# Patient Record
Sex: Male | Born: 1945 | Hispanic: No | Marital: Married
Health system: Southern US, Community
[De-identification: ages and names within clinical notes are randomized; demographics above are authoritative.]

## PROBLEM LIST (undated history)

## (undated) DIAGNOSIS — S02113A Unspecified occipital condyle fracture, initial encounter for closed fracture: Secondary | ICD-10-CM

## (undated) DIAGNOSIS — S0285XA Fracture of orbit, unspecified, initial encounter for closed fracture: Secondary | ICD-10-CM

## (undated) DIAGNOSIS — I609 Nontraumatic subarachnoid hemorrhage, unspecified: Secondary | ICD-10-CM

## (undated) DIAGNOSIS — S065X9A Traumatic subdural hemorrhage with loss of consciousness of unspecified duration, initial encounter: Secondary | ICD-10-CM

## (undated) DIAGNOSIS — S065XAA Traumatic subdural hemorrhage with loss of consciousness status unknown, initial encounter: Secondary | ICD-10-CM

## (undated) DIAGNOSIS — S32009A Unspecified fracture of unspecified lumbar vertebra, initial encounter for closed fracture: Secondary | ICD-10-CM

## (undated) DIAGNOSIS — S12000A Unspecified displaced fracture of first cervical vertebra, initial encounter for closed fracture: Secondary | ICD-10-CM

## (undated) DIAGNOSIS — S36029A Unspecified contusion of spleen, initial encounter: Secondary | ICD-10-CM

## (undated) DIAGNOSIS — S22069A Unspecified fracture of T7-T8 vertebra, initial encounter for closed fracture: Secondary | ICD-10-CM

## (undated) DIAGNOSIS — S82891A Other fracture of right lower leg, initial encounter for closed fracture: Secondary | ICD-10-CM

## (undated) DIAGNOSIS — F419 Anxiety disorder, unspecified: Secondary | ICD-10-CM

## (undated) DIAGNOSIS — S27329A Contusion of lung, unspecified, initial encounter: Secondary | ICD-10-CM

## (undated) DIAGNOSIS — S22059A Unspecified fracture of T5-T6 vertebra, initial encounter for closed fracture: Secondary | ICD-10-CM

## (undated) DIAGNOSIS — D696 Thrombocytopenia, unspecified: Secondary | ICD-10-CM

## (undated) DIAGNOSIS — S12400A Unspecified displaced fracture of fifth cervical vertebra, initial encounter for closed fracture: Secondary | ICD-10-CM

## (undated) DIAGNOSIS — Z9911 Dependence on respirator [ventilator] status: Secondary | ICD-10-CM

## (undated) DIAGNOSIS — J9600 Acute respiratory failure, unspecified whether with hypoxia or hypercapnia: Secondary | ICD-10-CM

---

## 2014-06-08 ENCOUNTER — Other Ambulatory Visit (HOSPITAL_COMMUNITY): Payer: Medicare Other

## 2014-06-08 ENCOUNTER — Ambulatory Visit (HOSPITAL_COMMUNITY)
Admission: AD | Admit: 2014-06-08 | Discharge: 2014-06-08 | Disposition: A | Discharge status: Home / Self Care | Payer: Medicare Other | Source: Other Acute Inpatient Hospital | Attending: Internal Medicine | Admitting: Internal Medicine

## 2014-06-08 ENCOUNTER — Inpatient Hospital Stay
Admission: AD | Admit: 2014-06-08 | Discharge: 2014-07-09 | Disposition: A | Discharge status: Rehabilitation Facility | Payer: Medicare Other | Source: Ambulatory Visit | Attending: Internal Medicine | Admitting: Internal Medicine

## 2014-06-08 DIAGNOSIS — J9600 Acute respiratory failure, unspecified whether with hypoxia or hypercapnia: Secondary | ICD-10-CM | POA: Diagnosis present

## 2014-06-08 DIAGNOSIS — Z4659 Encounter for fitting and adjustment of other gastrointestinal appliance and device: Secondary | ICD-10-CM

## 2014-06-08 DIAGNOSIS — Z13810 Encounter for screening for upper gastrointestinal disorder: Secondary | ICD-10-CM

## 2014-06-08 DIAGNOSIS — J9690 Respiratory failure, unspecified, unspecified whether with hypoxia or hypercapnia: Secondary | ICD-10-CM | POA: Diagnosis present

## 2014-06-08 DIAGNOSIS — J9620 Acute and chronic respiratory failure, unspecified whether with hypoxia or hypercapnia: Secondary | ICD-10-CM

## 2014-06-08 DIAGNOSIS — R52 Pain, unspecified: Secondary | ICD-10-CM

## 2014-06-08 DIAGNOSIS — K567 Ileus, unspecified: Secondary | ICD-10-CM

## 2014-06-08 DIAGNOSIS — Z930 Tracheostomy status: Secondary | ICD-10-CM

## 2014-06-08 DIAGNOSIS — Z452 Encounter for adjustment and management of vascular access device: Secondary | ICD-10-CM

## 2014-06-08 DIAGNOSIS — R110 Nausea: Secondary | ICD-10-CM

## 2014-06-08 DIAGNOSIS — S12400S Unspecified displaced fracture of fifth cervical vertebra, sequela: Secondary | ICD-10-CM

## 2014-06-08 DIAGNOSIS — R143 Flatulence: Secondary | ICD-10-CM

## 2014-06-08 DIAGNOSIS — S069X4A Unspecified intracranial injury with loss of consciousness of 6 hours to 24 hours, initial encounter: Secondary | ICD-10-CM | POA: Diagnosis present

## 2014-06-08 DIAGNOSIS — E43 Unspecified severe protein-calorie malnutrition: Secondary | ICD-10-CM | POA: Insufficient documentation

## 2014-06-08 DIAGNOSIS — R140 Abdominal distension (gaseous): Secondary | ICD-10-CM

## 2014-06-08 DIAGNOSIS — T148XXA Other injury of unspecified body region, initial encounter: Secondary | ICD-10-CM

## 2014-06-08 DIAGNOSIS — R0902 Hypoxemia: Secondary | ICD-10-CM

## 2014-06-08 DIAGNOSIS — S12400A Unspecified displaced fracture of fifth cervical vertebra, initial encounter for closed fracture: Secondary | ICD-10-CM | POA: Diagnosis present

## 2014-06-08 DIAGNOSIS — J9621 Acute and chronic respiratory failure with hypoxia: Secondary | ICD-10-CM

## 2014-06-08 DIAGNOSIS — R1314 Dysphagia, pharyngoesophageal phase: Secondary | ICD-10-CM | POA: Insufficient documentation

## 2014-06-08 DIAGNOSIS — J189 Pneumonia, unspecified organism: Secondary | ICD-10-CM

## 2014-06-08 DIAGNOSIS — N3090 Cystitis, unspecified without hematuria: Secondary | ICD-10-CM

## 2014-06-08 DIAGNOSIS — Z431 Encounter for attention to gastrostomy: Secondary | ICD-10-CM

## 2014-06-08 HISTORY — DX: Traumatic subdural hemorrhage with loss of consciousness of unspecified duration, initial encounter: S06.5X9A

## 2014-06-08 HISTORY — DX: Other fracture of right lower leg, initial encounter for closed fracture: S82.891A

## 2014-06-08 HISTORY — DX: Unspecified fracture of T7-t8 vertebra, initial encounter for closed fracture: S22.069A

## 2014-06-08 HISTORY — DX: Thrombocytopenia, unspecified: D69.6

## 2014-06-08 HISTORY — DX: Unspecified fracture of t5-T6 vertebra, initial encounter for closed fracture: S22.059A

## 2014-06-08 HISTORY — DX: Unspecified occipital condyle fracture, initial encounter for closed fracture: S02.113A

## 2014-06-08 HISTORY — DX: Unspecified fracture of unspecified lumbar vertebra, initial encounter for closed fracture: S32.009A

## 2014-06-08 HISTORY — DX: Dependence on respirator (ventilator) status: Z99.11

## 2014-06-08 HISTORY — DX: Unspecified displaced fracture of first cervical vertebra, initial encounter for closed fracture: S12.000A

## 2014-06-08 HISTORY — DX: Acute respiratory failure, unspecified whether with hypoxia or hypercapnia: J96.00

## 2014-06-08 HISTORY — DX: Anxiety disorder, unspecified: F41.9

## 2014-06-08 HISTORY — DX: Unspecified displaced fracture of fifth cervical vertebra, initial encounter for closed fracture: S12.400A

## 2014-06-08 HISTORY — DX: Traumatic subdural hemorrhage with loss of consciousness status unknown, initial encounter: S06.5XAA

## 2014-06-08 HISTORY — DX: Contusion of lung, unspecified, initial encounter: S27.329A

## 2014-06-08 HISTORY — DX: Fracture of orbit, unspecified, initial encounter for closed fracture: S02.85XA

## 2014-06-08 HISTORY — DX: Nontraumatic subarachnoid hemorrhage, unspecified: I60.9

## 2014-06-08 HISTORY — DX: Unspecified contusion of spleen, initial encounter: S36.029A

## 2014-06-08 LAB — BLOOD GAS, ARTERIAL
Acid-Base Excess: 4.8 mmol/L — ABNORMAL HIGH (ref 0.0–2.0)
Bicarbonate: 28.3 mEq/L — ABNORMAL HIGH (ref 20.0–24.0)
FIO2: 0.4 %
MECHVT: 500 mL
O2 Saturation: 94.3 %
PCO2 ART: 38.9 mmHg (ref 35.0–45.0)
PEEP: 5 cmH2O
Patient temperature: 98.6
RATE: 16 resp/min
TCO2: 29.5 mmol/L (ref 0–100)
pH, Arterial: 7.476 — ABNORMAL HIGH (ref 7.350–7.450)
pO2, Arterial: 74.6 mmHg — ABNORMAL LOW (ref 80.0–100.0)

## 2014-06-09 ENCOUNTER — Encounter: Payer: Self-pay | Admitting: Pulmonary Disease

## 2014-06-09 ENCOUNTER — Other Ambulatory Visit (HOSPITAL_COMMUNITY): Payer: Medicare Other

## 2014-06-09 DIAGNOSIS — Z930 Tracheostomy status: Secondary | ICD-10-CM

## 2014-06-09 DIAGNOSIS — J9600 Acute respiratory failure, unspecified whether with hypoxia or hypercapnia: Secondary | ICD-10-CM | POA: Diagnosis present

## 2014-06-09 DIAGNOSIS — J189 Pneumonia, unspecified organism: Secondary | ICD-10-CM

## 2014-06-09 DIAGNOSIS — S12400A Unspecified displaced fracture of fifth cervical vertebra, initial encounter for closed fracture: Secondary | ICD-10-CM | POA: Diagnosis present

## 2014-06-09 DIAGNOSIS — J9620 Acute and chronic respiratory failure, unspecified whether with hypoxia or hypercapnia: Secondary | ICD-10-CM

## 2014-06-09 DIAGNOSIS — S069X4A Unspecified intracranial injury with loss of consciousness of 6 hours to 24 hours, initial encounter: Secondary | ICD-10-CM | POA: Diagnosis present

## 2014-06-09 LAB — URINALYSIS, ROUTINE W REFLEX MICROSCOPIC
Bilirubin Urine: NEGATIVE
Glucose, UA: NEGATIVE mg/dL
KETONES UR: NEGATIVE mg/dL
Leukocytes, UA: NEGATIVE
Nitrite: NEGATIVE
Protein, ur: 30 mg/dL — AB
Specific Gravity, Urine: 1.028 (ref 1.005–1.030)
UROBILINOGEN UA: 4 mg/dL — AB (ref 0.0–1.0)
pH: 5.5 (ref 5.0–8.0)

## 2014-06-09 LAB — CBC
HEMATOCRIT: 26.9 % — AB (ref 39.0–52.0)
HEMOGLOBIN: 8.7 g/dL — AB (ref 13.0–17.0)
MCH: 28.8 pg (ref 26.0–34.0)
MCHC: 32.3 g/dL (ref 30.0–36.0)
MCV: 89.1 fL (ref 78.0–100.0)
Platelets: 456 10*3/uL — ABNORMAL HIGH (ref 150–400)
RBC: 3.02 MIL/uL — ABNORMAL LOW (ref 4.22–5.81)
RDW: 14 % (ref 11.5–15.5)
WBC: 11.8 10*3/uL — ABNORMAL HIGH (ref 4.0–10.5)

## 2014-06-09 LAB — VANCOMYCIN, TROUGH: Vancomycin Tr: 9.2 ug/mL — ABNORMAL LOW (ref 10.0–20.0)

## 2014-06-09 LAB — COMPREHENSIVE METABOLIC PANEL
ALK PHOS: 118 U/L — AB (ref 39–117)
ALT: 45 U/L (ref 0–53)
AST: 61 U/L — ABNORMAL HIGH (ref 0–37)
Albumin: 1.5 g/dL — ABNORMAL LOW (ref 3.5–5.2)
Anion gap: 7 (ref 5–15)
BUN: 32 mg/dL — AB (ref 6–23)
CHLORIDE: 112 meq/L (ref 96–112)
CO2: 28 mEq/L (ref 19–32)
Calcium: 8.3 mg/dL — ABNORMAL LOW (ref 8.4–10.5)
Creatinine, Ser: 0.56 mg/dL (ref 0.50–1.35)
GFR calc Af Amer: >90 mL/min (ref >90)
GFR calc non Af Amer: >90 mL/min (ref >90)
Glucose, Bld: 120 mg/dL — ABNORMAL HIGH (ref 70–99)
POTASSIUM: 4.4 meq/L (ref 3.7–5.3)
Sodium: 147 mEq/L (ref 137–147)
Total Bilirubin: 0.5 mg/dL (ref 0.3–1.2)
Total Protein: 5.8 g/dL — ABNORMAL LOW (ref 6.0–8.3)

## 2014-06-09 LAB — PREALBUMIN: Prealbumin: 7.3 mg/dL — ABNORMAL LOW (ref 17.0–34.0)

## 2014-06-09 LAB — URINE MICROSCOPIC-ADD ON

## 2014-06-09 LAB — PROCALCITONIN: PROCALCITONIN: 0.64 ng/mL

## 2014-06-09 LAB — TSH: TSH: 0.376 u[IU]/mL (ref 0.350–4.500)
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
## 2014-06-10 LAB — HEMOGLOBIN A1C
HEMOGLOBIN A1C: 5.9 % — AB (ref <5.7)
Mean Plasma Glucose: 123 mg/dL — ABNORMAL HIGH (ref <117)

## 2014-06-10 LAB — URINE MICROSCOPIC-ADD ON

## 2014-06-10 LAB — URINALYSIS, ROUTINE W REFLEX MICROSCOPIC
BILIRUBIN URINE: NEGATIVE
GLUCOSE, UA: NEGATIVE mg/dL
KETONES UR: NEGATIVE mg/dL
Leukocytes, UA: NEGATIVE
Nitrite: NEGATIVE
PH: 5.5 (ref 5.0–8.0)
Protein, ur: NEGATIVE mg/dL
Specific Gravity, Urine: 1.024 (ref 1.005–1.030)
Urobilinogen, UA: 1 mg/dL (ref 0.0–1.0)

## 2014-06-10 LAB — BASIC METABOLIC PANEL
ANION GAP: 9 (ref 5–15)
BUN: 31 mg/dL — ABNORMAL HIGH (ref 6–23)
CALCIUM: 8 mg/dL — AB (ref 8.4–10.5)
CO2: 28 mEq/L (ref 19–32)
CREATININE: 0.62 mg/dL (ref 0.50–1.35)
Chloride: 110 mEq/L (ref 96–112)
GFR calc Af Amer: >90 mL/min (ref >90)
GFR calc non Af Amer: >90 mL/min (ref >90)
Glucose, Bld: 134 mg/dL — ABNORMAL HIGH (ref 70–99)
Potassium: 4.1 mEq/L (ref 3.7–5.3)
Sodium: 147 mEq/L (ref 137–147)

## 2014-06-10 LAB — CBC
HCT: 31.8 % — ABNORMAL LOW (ref 39.0–52.0)
Hemoglobin: 10 g/dL — ABNORMAL LOW (ref 13.0–17.0)
MCH: 28.2 pg (ref 26.0–34.0)
MCHC: 31.4 g/dL (ref 30.0–36.0)
MCV: 89.6 fL (ref 78.0–100.0)
PLATELETS: 372 10*3/uL (ref 150–400)
RBC: 3.55 MIL/uL — ABNORMAL LOW (ref 4.22–5.81)
RDW: 14.2 % (ref 11.5–15.5)
WBC: 11 10*3/uL — ABNORMAL HIGH (ref 4.0–10.5)

## 2014-06-10 LAB — MAGNESIUM: Magnesium: 2.3 mg/dL (ref 1.5–2.5)

## 2014-06-10 LAB — PHOSPHORUS: Phosphorus: 3 mg/dL (ref 2.3–4.6)
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
## 2014-06-11 DIAGNOSIS — S12400S Unspecified displaced fracture of fifth cervical vertebra, sequela: Secondary | ICD-10-CM

## 2014-06-11 DIAGNOSIS — Z930 Tracheostomy status: Secondary | ICD-10-CM

## 2014-06-11 DIAGNOSIS — J9621 Acute and chronic respiratory failure with hypoxia: Secondary | ICD-10-CM

## 2014-06-11 LAB — URINE CULTURE
Colony Count: NO GROWTH
Culture: NO GROWTH

## 2014-06-11 LAB — VANCOMYCIN, TROUGH: VANCOMYCIN TR: 10.3 ug/mL (ref 10.0–20.0)

## 2014-06-11 LAB — VITAMIN D 25 HYDROXY (VIT D DEFICIENCY, FRACTURES): Vit D, 25-Hydroxy: 70 ng/mL (ref 30–89)
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
## 2014-06-12 LAB — CULTURE, RESPIRATORY W GRAM STAIN

## 2014-06-12 LAB — CULTURE, RESPIRATORY

## 2014-06-13 LAB — TOBRAMYCIN LEVEL, RANDOM: TOBRAMYCIN RM: 1.6 ug/mL

## 2014-06-15 ENCOUNTER — Other Ambulatory Visit (HOSPITAL_COMMUNITY): Payer: Medicare Other

## 2014-06-15 LAB — BLOOD GAS, ARTERIAL
ACID-BASE EXCESS: 5.3 mmol/L — AB (ref 0.0–2.0)
Bicarbonate: 28.5 mEq/L — ABNORMAL HIGH (ref 20.0–24.0)
FIO2: 0.28 %
O2 Saturation: 96.4 %
PEEP: 5 cmH2O
Patient temperature: 98.6
RATE: 16 resp/min
TCO2: 29.6 mmol/L (ref 0–100)
VT: 500 mL
pCO2 arterial: 36.2 mmHg (ref 35.0–45.0)
pH, Arterial: 7.507 — ABNORMAL HIGH (ref 7.350–7.450)
pO2, Arterial: 79.2 mmHg — ABNORMAL LOW (ref 80.0–100.0)

## 2014-06-15 LAB — COMPREHENSIVE METABOLIC PANEL
ALBUMIN: 1.8 g/dL — AB (ref 3.5–5.2)
ALT: 43 U/L (ref 0–53)
ANION GAP: 11 (ref 5–15)
AST: 33 U/L (ref 0–37)
Alkaline Phosphatase: 132 U/L — ABNORMAL HIGH (ref 39–117)
BUN: 30 mg/dL — AB (ref 6–23)
CALCIUM: 8.2 mg/dL — AB (ref 8.4–10.5)
CO2: 27 mEq/L (ref 19–32)
CREATININE: 0.52 mg/dL (ref 0.50–1.35)
Chloride: 105 mEq/L (ref 96–112)
GFR calc Af Amer: >90 mL/min (ref >90)
GFR calc non Af Amer: >90 mL/min (ref >90)
Glucose, Bld: 146 mg/dL — ABNORMAL HIGH (ref 70–99)
Potassium: 4.1 mEq/L (ref 3.7–5.3)
Sodium: 143 mEq/L (ref 137–147)
TOTAL PROTEIN: 6 g/dL (ref 6.0–8.3)
Total Bilirubin: 0.4 mg/dL (ref 0.3–1.2)

## 2014-06-15 LAB — CBC
HEMATOCRIT: 29.1 % — AB (ref 39.0–52.0)
HEMOGLOBIN: 9.3 g/dL — AB (ref 13.0–17.0)
MCH: 28.5 pg (ref 26.0–34.0)
MCHC: 32 g/dL (ref 30.0–36.0)
MCV: 89.3 fL (ref 78.0–100.0)
Platelets: 289 10*3/uL (ref 150–400)
RBC: 3.26 MIL/uL — AB (ref 4.22–5.81)
RDW: 14.4 % (ref 11.5–15.5)
WBC: 14.5 10*3/uL — AB (ref 4.0–10.5)

## 2014-06-15 LAB — VANCOMYCIN, TROUGH: Vancomycin Tr: <5 ug/mL — ABNORMAL LOW (ref 10.0–20.0)

## 2014-06-15 LAB — PHOSPHORUS: PHOSPHORUS: 3.4 mg/dL (ref 2.3–4.6)

## 2014-06-15 LAB — MAGNESIUM: Magnesium: 2.1 mg/dL (ref 1.5–2.5)

## 2014-06-15 LAB — TOBRAMYCIN LEVEL, TROUGH: TOBRAMYCIN TR: 0.5 ug/mL (ref 0.5–2.0)
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
## 2014-06-15 NOTE — Progress Notes (Signed)
Patient ID: Hunter MartesDwight Calles, male   DOB: 12/27/1945, 68 y.o.   MRN: [id number]  Poss Perc G tube  10/12 KUB unchanged ileus Cannot safely perform perc G tube Await ileus to resolve  Please re order when resolved
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
## 2014-06-16 LAB — CULTURE, BLOOD (ROUTINE X 2)
CULTURE: NO GROWTH
Culture: NO GROWTH
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
## 2014-06-17 ENCOUNTER — Other Ambulatory Visit (HOSPITAL_COMMUNITY): Payer: Medicare Other

## 2014-06-17 LAB — DIFFERENTIAL
BASOS PCT: 0 % (ref 0–1)
Basophils Absolute: 0 10*3/uL (ref 0.0–0.1)
EOS ABS: 0.2 10*3/uL (ref 0.0–0.7)
Eosinophils Relative: 3 % (ref 0–5)
Lymphocytes Relative: 10 % — ABNORMAL LOW (ref 12–46)
Lymphs Abs: 0.8 10*3/uL (ref 0.7–4.0)
MONO ABS: 0.5 10*3/uL (ref 0.1–1.0)
Monocytes Relative: 7 % (ref 3–12)
NEUTROS ABS: 6.4 10*3/uL (ref 1.7–7.7)
Neutrophils Relative %: 80 % — ABNORMAL HIGH (ref 43–77)

## 2014-06-17 LAB — COMPREHENSIVE METABOLIC PANEL
ALBUMIN: 1.6 g/dL — AB (ref 3.5–5.2)
ALT: 26 U/L (ref 0–53)
AST: 19 U/L (ref 0–37)
Alkaline Phosphatase: 99 U/L (ref 39–117)
Anion gap: 9 (ref 5–15)
BUN: 27 mg/dL — AB (ref 6–23)
CO2: 26 mEq/L (ref 19–32)
CREATININE: 0.48 mg/dL — AB (ref 0.50–1.35)
Calcium: 8 mg/dL — ABNORMAL LOW (ref 8.4–10.5)
Chloride: 105 mEq/L (ref 96–112)
GFR calc Af Amer: >90 mL/min (ref >90)
GFR calc non Af Amer: >90 mL/min (ref >90)
Glucose, Bld: 141 mg/dL — ABNORMAL HIGH (ref 70–99)
Potassium: 4.1 mEq/L (ref 3.7–5.3)
Sodium: 140 mEq/L (ref 137–147)
TOTAL PROTEIN: 5.5 g/dL — AB (ref 6.0–8.3)
Total Bilirubin: 0.3 mg/dL (ref 0.3–1.2)

## 2014-06-17 LAB — CBC
HCT: 26.1 % — ABNORMAL LOW (ref 39.0–52.0)
Hemoglobin: 8.3 g/dL — ABNORMAL LOW (ref 13.0–17.0)
MCH: 29.1 pg (ref 26.0–34.0)
MCHC: 31.8 g/dL (ref 30.0–36.0)
MCV: 91.6 fL (ref 78.0–100.0)
Platelets: 230 10*3/uL (ref 150–400)
RBC: 2.85 MIL/uL — ABNORMAL LOW (ref 4.22–5.81)
RDW: 14.9 % (ref 11.5–15.5)
WBC: 7.9 10*3/uL (ref 4.0–10.5)

## 2014-06-17 LAB — PHOSPHORUS: Phosphorus: 3.5 mg/dL (ref 2.3–4.6)

## 2014-06-17 LAB — MAGNESIUM: Magnesium: 2 mg/dL (ref 1.5–2.5)

## 2014-06-17 LAB — PREALBUMIN: Prealbumin: 13.3 mg/dL — ABNORMAL LOW (ref 17.0–34.0)

## 2014-06-17 LAB — TRIGLYCERIDES: TRIGLYCERIDES: 52 mg/dL (ref <150)

## 2014-06-18 ENCOUNTER — Other Ambulatory Visit (HOSPITAL_COMMUNITY): Payer: Medicare Other
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
## 2014-06-19 ENCOUNTER — Other Ambulatory Visit (HOSPITAL_COMMUNITY): Payer: Medicare Other

## 2014-06-19 LAB — BASIC METABOLIC PANEL
ANION GAP: 9 (ref 5–15)
BUN: 19 mg/dL (ref 6–23)
CALCIUM: 8.1 mg/dL — AB (ref 8.4–10.5)
CO2: 28 mEq/L (ref 19–32)
Chloride: 100 mEq/L (ref 96–112)
Creatinine, Ser: 0.44 mg/dL — ABNORMAL LOW (ref 0.50–1.35)
GLUCOSE: 158 mg/dL — AB (ref 70–99)
POTASSIUM: 4 meq/L (ref 3.7–5.3)
SODIUM: 137 meq/L (ref 137–147)

## 2014-06-19 LAB — HEPATIC FUNCTION PANEL
ALT: 21 U/L (ref 0–53)
AST: 20 U/L (ref 0–37)
Albumin: 1.8 g/dL — ABNORMAL LOW (ref 3.5–5.2)
Alkaline Phosphatase: 112 U/L (ref 39–117)
Total Bilirubin: 0.4 mg/dL (ref 0.3–1.2)
Total Protein: 5.9 g/dL — ABNORMAL LOW (ref 6.0–8.3)

## 2014-06-19 LAB — MAGNESIUM: MAGNESIUM: 1.9 mg/dL (ref 1.5–2.5)

## 2014-06-19 LAB — TRIGLYCERIDES: TRIGLYCERIDES: 43 mg/dL (ref <150)

## 2014-06-19 LAB — PHOSPHORUS: PHOSPHORUS: 3 mg/dL (ref 2.3–4.6)
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
## 2014-06-22 ENCOUNTER — Other Ambulatory Visit (HOSPITAL_COMMUNITY): Payer: Medicare Other

## 2014-06-22 LAB — TRIGLYCERIDES: TRIGLYCERIDES: 68 mg/dL (ref <150)

## 2014-06-22 LAB — COMPREHENSIVE METABOLIC PANEL
ALT: 54 U/L — ABNORMAL HIGH (ref 0–53)
AST: 48 U/L — AB (ref 0–37)
Albumin: 2.1 g/dL — ABNORMAL LOW (ref 3.5–5.2)
Alkaline Phosphatase: 148 U/L — ABNORMAL HIGH (ref 39–117)
Anion gap: 9 (ref 5–15)
BUN: 20 mg/dL (ref 6–23)
CALCIUM: 8.9 mg/dL (ref 8.4–10.5)
CHLORIDE: 99 meq/L (ref 96–112)
CO2: 30 mEq/L (ref 19–32)
Creatinine, Ser: 0.45 mg/dL — ABNORMAL LOW (ref 0.50–1.35)
GFR calc Af Amer: >90 mL/min (ref >90)
GFR calc non Af Amer: >90 mL/min (ref >90)
Glucose, Bld: 91 mg/dL (ref 70–99)
Potassium: 4.2 mEq/L (ref 3.7–5.3)
Sodium: 138 mEq/L (ref 137–147)
TOTAL PROTEIN: 6.8 g/dL (ref 6.0–8.3)
Total Bilirubin: 0.7 mg/dL (ref 0.3–1.2)

## 2014-06-22 LAB — MAGNESIUM: Magnesium: 2.1 mg/dL (ref 1.5–2.5)

## 2014-06-22 LAB — PHOSPHORUS: Phosphorus: 3.7 mg/dL (ref 2.3–4.6)

## 2014-06-22 LAB — CBC
HEMATOCRIT: 32.8 % — AB (ref 39.0–52.0)
HEMOGLOBIN: 10.6 g/dL — AB (ref 13.0–17.0)
MCH: 29.4 pg (ref 26.0–34.0)
MCHC: 32.3 g/dL (ref 30.0–36.0)
MCV: 90.9 fL (ref 78.0–100.0)
Platelets: 340 10*3/uL (ref 150–400)
RBC: 3.61 MIL/uL — AB (ref 4.22–5.81)
RDW: 15.2 % (ref 11.5–15.5)
WBC: 9.8 10*3/uL (ref 4.0–10.5)

## 2014-06-24 LAB — HEPATIC FUNCTION PANEL
ALT: 34 U/L (ref 0–53)
AST: 23 U/L (ref 0–37)
Albumin: 2.2 g/dL — ABNORMAL LOW (ref 3.5–5.2)
Alkaline Phosphatase: 158 U/L — ABNORMAL HIGH (ref 39–117)
BILIRUBIN TOTAL: 0.6 mg/dL (ref 0.3–1.2)
Bilirubin, Direct: 0.2 mg/dL (ref 0.0–0.3)
Indirect Bilirubin: 0.4 mg/dL (ref 0.3–0.9)
TOTAL PROTEIN: 6.9 g/dL (ref 6.0–8.3)

## 2014-06-25 ENCOUNTER — Other Ambulatory Visit (HOSPITAL_COMMUNITY): Payer: Medicare Other

## 2014-06-25 LAB — HEPATIC FUNCTION PANEL
ALT: 30 U/L (ref 0–53)
AST: 31 U/L (ref 0–37)
Albumin: 2.2 g/dL — ABNORMAL LOW (ref 3.5–5.2)
Alkaline Phosphatase: 158 U/L — ABNORMAL HIGH (ref 39–117)
Bilirubin, Direct: <0.2 mg/dL (ref 0.0–0.3)
Total Bilirubin: 0.7 mg/dL (ref 0.3–1.2)
Total Protein: 7 g/dL (ref 6.0–8.3)

## 2014-06-25 LAB — BASIC METABOLIC PANEL
Anion gap: 11 (ref 5–15)
BUN: 26 mg/dL — ABNORMAL HIGH (ref 6–23)
CO2: 28 mEq/L (ref 19–32)
Calcium: 9.3 mg/dL (ref 8.4–10.5)
Chloride: 99 mEq/L (ref 96–112)
Creatinine, Ser: 0.55 mg/dL (ref 0.50–1.35)
GFR calc Af Amer: >90 mL/min (ref >90)
GFR calc non Af Amer: >90 mL/min (ref >90)
Glucose, Bld: 96 mg/dL (ref 70–99)
Potassium: 4.3 mEq/L (ref 3.7–5.3)
Sodium: 138 mEq/L (ref 137–147)

## 2014-06-25 LAB — CBC
HCT: 34.2 % — ABNORMAL LOW (ref 39.0–52.0)
Hemoglobin: 10.7 g/dL — ABNORMAL LOW (ref 13.0–17.0)
MCH: 28.9 pg (ref 26.0–34.0)
MCHC: 31.3 g/dL (ref 30.0–36.0)
MCV: 92.4 fL (ref 78.0–100.0)
Platelets: 311 10*3/uL (ref 150–400)
RBC: 3.7 MIL/uL — ABNORMAL LOW (ref 4.22–5.81)
RDW: 15.7 % — ABNORMAL HIGH (ref 11.5–15.5)
WBC: 11 10*3/uL — ABNORMAL HIGH (ref 4.0–10.5)

## 2014-06-25 LAB — TRIGLYCERIDES: Triglycerides: 65 mg/dL (ref <150)

## 2014-06-25 MED ORDER — TECHNETIUM TC 99M MEBROFENIN IV KIT
5.0000 | PACK | Freq: Once | INTRAVENOUS | Status: AC | PRN
  Administered 2014-06-25: 5 via INTRAVENOUS

## 2014-06-26 ENCOUNTER — Other Ambulatory Visit (HOSPITAL_COMMUNITY): Payer: Medicare Other

## 2014-06-26 LAB — BASIC METABOLIC PANEL
ANION GAP: 9 (ref 5–15)
BUN: 26 mg/dL — ABNORMAL HIGH (ref 6–23)
CALCIUM: 8.8 mg/dL (ref 8.4–10.5)
CO2: 31 mEq/L (ref 19–32)
Chloride: 100 mEq/L (ref 96–112)
Creatinine, Ser: 0.55 mg/dL (ref 0.50–1.35)
GFR calc Af Amer: >90 mL/min (ref >90)
Glucose, Bld: 144 mg/dL — ABNORMAL HIGH (ref 70–99)
Potassium: 3.3 mEq/L — ABNORMAL LOW (ref 3.7–5.3)
SODIUM: 140 meq/L (ref 137–147)

## 2014-06-26 LAB — PHOSPHORUS: PHOSPHORUS: 3.5 mg/dL (ref 2.3–4.6)

## 2014-06-26 LAB — MAGNESIUM: Magnesium: 1.9 mg/dL (ref 1.5–2.5)

## 2014-06-28 ENCOUNTER — Other Ambulatory Visit (HOSPITAL_COMMUNITY): Payer: Medicare Other

## 2014-06-28 LAB — BASIC METABOLIC PANEL
Anion gap: 11 (ref 5–15)
BUN: 28 mg/dL — AB (ref 6–23)
CO2: 27 mEq/L (ref 19–32)
Calcium: 8.9 mg/dL (ref 8.4–10.5)
Chloride: 100 mEq/L (ref 96–112)
Creatinine, Ser: 0.51 mg/dL (ref 0.50–1.35)
Glucose, Bld: 113 mg/dL — ABNORMAL HIGH (ref 70–99)
Potassium: 3.8 mEq/L (ref 3.7–5.3)
Sodium: 138 mEq/L (ref 137–147)

## 2014-06-28 LAB — MAGNESIUM: Magnesium: 2 mg/dL (ref 1.5–2.5)

## 2014-06-29 ENCOUNTER — Other Ambulatory Visit (HOSPITAL_COMMUNITY): Payer: Medicare Other

## 2014-06-29 LAB — BASIC METABOLIC PANEL
ANION GAP: 14 (ref 5–15)
BUN: 27 mg/dL — ABNORMAL HIGH (ref 6–23)
CALCIUM: 8.8 mg/dL (ref 8.4–10.5)
CO2: 23 mEq/L (ref 19–32)
CREATININE: 1.3 mg/dL (ref 0.50–1.35)
Chloride: 103 mEq/L (ref 96–112)
GFR calc Af Amer: 63 mL/min — ABNORMAL LOW (ref >90)
GFR calc non Af Amer: 54 mL/min — ABNORMAL LOW (ref >90)
Glucose, Bld: 76 mg/dL (ref 70–99)
Potassium: 4.2 mEq/L (ref 3.7–5.3)
Sodium: 140 mEq/L (ref 137–147)

## 2014-06-29 LAB — CBC
HCT: 28 % — ABNORMAL LOW (ref 39.0–52.0)
Hemoglobin: 8.4 g/dL — ABNORMAL LOW (ref 13.0–17.0)
MCH: 23.7 pg — ABNORMAL LOW (ref 26.0–34.0)
MCHC: 30 g/dL (ref 30.0–36.0)
MCV: 78.9 fL (ref 78.0–100.0)
PLATELETS: 365 10*3/uL (ref 150–400)
RBC: 3.55 MIL/uL — AB (ref 4.22–5.81)
RDW: 20.3 % — AB (ref 11.5–15.5)
WBC: 13.4 10*3/uL — ABNORMAL HIGH (ref 4.0–10.5)

## 2014-06-30 ENCOUNTER — Other Ambulatory Visit (HOSPITAL_COMMUNITY): Payer: Medicare Other

## 2014-07-01 ENCOUNTER — Institutional Professional Consult (permissible substitution) (HOSPITAL_COMMUNITY): Payer: Medicare Other

## 2014-07-02 ENCOUNTER — Other Ambulatory Visit (HOSPITAL_COMMUNITY): Payer: Medicare Other

## 2014-07-02 LAB — BASIC METABOLIC PANEL
Anion gap: 9 (ref 5–15)
BUN: 17 mg/dL (ref 6–23)
CALCIUM: 8.7 mg/dL (ref 8.4–10.5)
CO2: 32 mEq/L (ref 19–32)
Chloride: 96 mEq/L (ref 96–112)
Creatinine, Ser: 0.46 mg/dL — ABNORMAL LOW (ref 0.50–1.35)
GFR calc Af Amer: >90 mL/min (ref >90)
Glucose, Bld: 111 mg/dL — ABNORMAL HIGH (ref 70–99)
Potassium: 4.1 mEq/L (ref 3.7–5.3)
Sodium: 137 mEq/L (ref 137–147)

## 2014-07-02 LAB — CBC
HCT: 35.7 % — ABNORMAL LOW (ref 39.0–52.0)
HEMOGLOBIN: 11.7 g/dL — AB (ref 13.0–17.0)
MCH: 29.5 pg (ref 26.0–34.0)
MCHC: 32.8 g/dL (ref 30.0–36.0)
MCV: 90.2 fL (ref 78.0–100.0)
Platelets: 299 10*3/uL (ref 150–400)
RBC: 3.96 MIL/uL — ABNORMAL LOW (ref 4.22–5.81)
RDW: 15.1 % (ref 11.5–15.5)
WBC: 12 10*3/uL — ABNORMAL HIGH (ref 4.0–10.5)

## 2014-07-03 ENCOUNTER — Other Ambulatory Visit (HOSPITAL_COMMUNITY): Payer: Medicare Other

## 2014-07-03 LAB — CBC
HEMATOCRIT: 32.6 % — AB (ref 39.0–52.0)
Hemoglobin: 11 g/dL — ABNORMAL LOW (ref 13.0–17.0)
MCH: 30 pg (ref 26.0–34.0)
MCHC: 33.7 g/dL (ref 30.0–36.0)
MCV: 88.8 fL (ref 78.0–100.0)
PLATELETS: 288 10*3/uL (ref 150–400)
RBC: 3.67 MIL/uL — ABNORMAL LOW (ref 4.22–5.81)
RDW: 14.8 % (ref 11.5–15.5)
WBC: 14.4 10*3/uL — ABNORMAL HIGH (ref 4.0–10.5)

## 2014-07-03 LAB — PROCALCITONIN: Procalcitonin: 0.12 ng/mL

## 2014-07-04 LAB — URINALYSIS, ROUTINE W REFLEX MICROSCOPIC
BILIRUBIN URINE: NEGATIVE
GLUCOSE, UA: NEGATIVE mg/dL
KETONES UR: NEGATIVE mg/dL
LEUKOCYTES UA: NEGATIVE
Nitrite: NEGATIVE
PROTEIN: NEGATIVE mg/dL
Specific Gravity, Urine: 1.014 (ref 1.005–1.030)
Urobilinogen, UA: 1 mg/dL (ref 0.0–1.0)
pH: 7.5 (ref 5.0–8.0)

## 2014-07-04 LAB — URINE MICROSCOPIC-ADD ON

## 2014-07-04 LAB — CBC
HCT: 33 % — ABNORMAL LOW (ref 39.0–52.0)
HEMOGLOBIN: 11 g/dL — AB (ref 13.0–17.0)
MCH: 28.9 pg (ref 26.0–34.0)
MCHC: 33.3 g/dL (ref 30.0–36.0)
MCV: 86.6 fL (ref 78.0–100.0)
Platelets: 363 10*3/uL (ref 150–400)
RBC: 3.81 MIL/uL — ABNORMAL LOW (ref 4.22–5.81)
RDW: 14.8 % (ref 11.5–15.5)
WBC: 10.9 10*3/uL — ABNORMAL HIGH (ref 4.0–10.5)

## 2014-07-05 LAB — BASIC METABOLIC PANEL
Anion gap: 8 (ref 5–15)
BUN: 12 mg/dL (ref 6–23)
CALCIUM: 9.1 mg/dL (ref 8.4–10.5)
CO2: 31 mEq/L (ref 19–32)
CREATININE: 0.44 mg/dL — AB (ref 0.50–1.35)
Chloride: 93 mEq/L — ABNORMAL LOW (ref 96–112)
GFR calc non Af Amer: >90 mL/min (ref >90)
GLUCOSE: 349 mg/dL — AB (ref 70–99)
POTASSIUM: 3.1 meq/L — AB (ref 3.7–5.3)
Sodium: 132 mEq/L — ABNORMAL LOW (ref 137–147)

## 2014-07-05 LAB — PROTIME-INR
INR: 1.25 (ref 0.00–1.49)
Prothrombin Time: 15.8 seconds — ABNORMAL HIGH (ref 11.6–15.2)

## 2014-07-05 LAB — CBC
HEMATOCRIT: 31.8 % — AB (ref 39.0–52.0)
HEMOGLOBIN: 10.5 g/dL — AB (ref 13.0–17.0)
MCH: 28.9 pg (ref 26.0–34.0)
MCHC: 33 g/dL (ref 30.0–36.0)
MCV: 87.6 fL (ref 78.0–100.0)
Platelets: 364 10*3/uL (ref 150–400)
RBC: 3.63 MIL/uL — ABNORMAL LOW (ref 4.22–5.81)
RDW: 14.6 % (ref 11.5–15.5)
WBC: 5.9 10*3/uL (ref 4.0–10.5)

## 2014-07-06 ENCOUNTER — Encounter: Payer: Self-pay | Admitting: Radiology

## 2014-07-06 ENCOUNTER — Other Ambulatory Visit (HOSPITAL_COMMUNITY): Payer: Medicare Other

## 2014-07-06 DIAGNOSIS — R1314 Dysphagia, pharyngoesophageal phase: Secondary | ICD-10-CM | POA: Insufficient documentation

## 2014-07-06 DIAGNOSIS — E43 Unspecified severe protein-calorie malnutrition: Secondary | ICD-10-CM | POA: Insufficient documentation

## 2014-07-06 LAB — CBC WITH DIFFERENTIAL/PLATELET
BASOS ABS: 0 10*3/uL (ref 0.0–0.1)
Basophils Relative: 1 % (ref 0–1)
EOS PCT: 1 % (ref 0–5)
Eosinophils Absolute: 0 10*3/uL (ref 0.0–0.7)
HEMATOCRIT: 31.7 % — AB (ref 39.0–52.0)
HEMOGLOBIN: 10.3 g/dL — AB (ref 13.0–17.0)
LYMPHS ABS: 0.8 10*3/uL (ref 0.7–4.0)
Lymphocytes Relative: 13 % (ref 12–46)
MCH: 28.4 pg (ref 26.0–34.0)
MCHC: 32.5 g/dL (ref 30.0–36.0)
MCV: 87.3 fL (ref 78.0–100.0)
MONO ABS: 0.6 10*3/uL (ref 0.1–1.0)
Monocytes Relative: 10 % (ref 3–12)
Neutro Abs: 4.6 10*3/uL (ref 1.7–7.7)
Neutrophils Relative %: 75 % (ref 43–77)
Platelets: 433 10*3/uL — ABNORMAL HIGH (ref 150–400)
RBC: 3.63 MIL/uL — AB (ref 4.22–5.81)
RDW: 14.6 % (ref 11.5–15.5)
WBC: 6 10*3/uL (ref 4.0–10.5)

## 2014-07-06 LAB — BASIC METABOLIC PANEL
ANION GAP: 11 (ref 5–15)
BUN: 14 mg/dL (ref 6–23)
CALCIUM: 8.8 mg/dL (ref 8.4–10.5)
CHLORIDE: 91 meq/L — AB (ref 96–112)
CO2: 29 meq/L (ref 19–32)
CREATININE: 0.47 mg/dL — AB (ref 0.50–1.35)
GFR calc Af Amer: >90 mL/min (ref >90)
GFR calc non Af Amer: >90 mL/min (ref >90)
Glucose, Bld: 503 mg/dL — ABNORMAL HIGH (ref 70–99)
Potassium: 3.3 mEq/L — ABNORMAL LOW (ref 3.7–5.3)
Sodium: 131 mEq/L — ABNORMAL LOW (ref 137–147)

## 2014-07-06 MED ORDER — FENTANYL CITRATE 0.05 MG/ML IJ SOLN
INTRAMUSCULAR | Status: AC
  Filled 2014-07-06: qty 4

## 2014-07-06 MED ORDER — GLUCAGON HCL RDNA (DIAGNOSTIC) 1 MG IJ SOLR
INTRAMUSCULAR | Status: AC
Start: 2014-07-06 — End: 2014-07-06
  Filled 2014-07-06: qty 1

## 2014-07-06 MED ORDER — GLUCAGON HCL RDNA (DIAGNOSTIC) 1 MG IJ SOLR
INTRAMUSCULAR | Status: AC | PRN
  Administered 2014-07-06: 1 mg via INTRAVENOUS

## 2014-07-06 MED ORDER — MIDAZOLAM HCL 2 MG/2ML IJ SOLN
INTRAMUSCULAR | Status: AC | PRN
  Administered 2014-07-06: 1 mg via INTRAVENOUS

## 2014-07-06 MED ORDER — LIDOCAINE HCL 1 % IJ SOLN
INTRAMUSCULAR | Status: AC
  Filled 2014-07-06: qty 20

## 2014-07-06 MED ORDER — FENTANYL CITRATE 0.05 MG/ML IJ SOLN
INTRAMUSCULAR | Status: AC | PRN
  Administered 2014-07-06: 25 ug via INTRAVENOUS

## 2014-07-06 MED ORDER — MIDAZOLAM HCL 2 MG/2ML IJ SOLN
INTRAMUSCULAR | Status: AC
Start: 2014-07-06 — End: 2014-07-06
  Filled 2014-07-06: qty 4

## 2014-07-06 MED ORDER — IOHEXOL 300 MG/ML  SOLN
50.0000 mL | Freq: Once | INTRAMUSCULAR | Status: AC | PRN
  Administered 2014-07-06: 1 mL

## 2014-07-06 MED ORDER — CEFAZOLIN SODIUM-DEXTROSE 2-3 GM-% IV SOLR
INTRAVENOUS | Status: AC
  Filled 2014-07-06: qty 50

## 2014-07-06 MED ORDER — CEFAZOLIN (ANCEF) 1 G IV SOLR
INTRAVENOUS | Status: AC | PRN
  Administered 2014-07-06: 2 g

## 2014-07-06 NOTE — Sedation Documentation (Signed)
RT and RN accompanied pt from [facility] for procedure. Report obtained from Dorene Grebelarinda Williams, Charity fundraiserN.  Stated she retook blood sugar and it was 112.  She stated elevated BS was d/t blood being drawn from PICC with D10 flowing thru it. Pt awake, alert and oriented to person and place.
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
## 2014-07-06 NOTE — Sedation Documentation (Signed)
Dr. Denny LevySchick in to s/w pt.

## 2014-07-06 NOTE — Procedures (Signed)
Successful 7120fr pull through GTUBE NO COMP STABLE FULL USE TOMORROW

## 2014-07-07 ENCOUNTER — Other Ambulatory Visit (HOSPITAL_COMMUNITY): Payer: Medicare Other

## 2014-07-07 LAB — BASIC METABOLIC PANEL
ANION GAP: 12 (ref 5–15)
BUN: 15 mg/dL (ref 6–23)
CALCIUM: 8.7 mg/dL (ref 8.4–10.5)
CHLORIDE: 101 meq/L (ref 96–112)
CO2: 27 mEq/L (ref 19–32)
Creatinine, Ser: 0.52 mg/dL (ref 0.50–1.35)
GFR calc Af Amer: >90 mL/min (ref >90)
GFR calc non Af Amer: >90 mL/min (ref >90)
GLUCOSE: 82 mg/dL (ref 70–99)
POTASSIUM: 3.3 meq/L — AB (ref 3.7–5.3)
SODIUM: 140 meq/L (ref 137–147)

## 2014-07-09 ENCOUNTER — Encounter: Payer: Self-pay | Admitting: Physical Medicine and Rehabilitation

## 2014-07-09 ENCOUNTER — Inpatient Hospital Stay (HOSPITAL_COMMUNITY)
Admission: RE | Admit: 2014-07-09 | Discharge: 2014-08-06 | DRG: 945 | Disposition: A | Discharge status: Skilled Nursing Facility | Payer: Medicare Other | Source: Intra-hospital | Attending: Physical Medicine & Rehabilitation | Admitting: Physical Medicine & Rehabilitation

## 2014-07-09 DIAGNOSIS — S12400D Unspecified displaced fracture of fifth cervical vertebra, subsequent encounter for fracture with routine healing: Secondary | ICD-10-CM

## 2014-07-09 DIAGNOSIS — J189 Pneumonia, unspecified organism: Secondary | ICD-10-CM | POA: Diagnosis present

## 2014-07-09 DIAGNOSIS — S069X4D Unspecified intracranial injury with loss of consciousness of 6 hours to 24 hours, subsequent encounter: Secondary | ICD-10-CM | POA: Diagnosis present

## 2014-07-09 DIAGNOSIS — R1314 Dysphagia, pharyngoesophageal phase: Secondary | ICD-10-CM | POA: Diagnosis present

## 2014-07-09 DIAGNOSIS — S12000S Unspecified displaced fracture of first cervical vertebra, sequela: Secondary | ICD-10-CM

## 2014-07-09 DIAGNOSIS — S22069D Unspecified fracture of T7-T8 vertebra, subsequent encounter for fracture with routine healing: Secondary | ICD-10-CM

## 2014-07-09 DIAGNOSIS — Z930 Tracheostomy status: Secondary | ICD-10-CM

## 2014-07-09 DIAGNOSIS — S069X4A Unspecified intracranial injury with loss of consciousness of 6 hours to 24 hours, initial encounter: Secondary | ICD-10-CM | POA: Diagnosis present

## 2014-07-09 DIAGNOSIS — S12000D Unspecified displaced fracture of first cervical vertebra, subsequent encounter for fracture with routine healing: Secondary | ICD-10-CM | POA: Diagnosis not present

## 2014-07-09 DIAGNOSIS — S22041A Stable burst fracture of fourth thoracic vertebra, initial encounter for closed fracture: Secondary | ICD-10-CM

## 2014-07-09 DIAGNOSIS — F419 Anxiety disorder, unspecified: Secondary | ICD-10-CM | POA: Diagnosis present

## 2014-07-09 DIAGNOSIS — S069X4S Unspecified intracranial injury with loss of consciousness of 6 hours to 24 hours, sequela: Secondary | ICD-10-CM

## 2014-07-09 DIAGNOSIS — S129XXA Fracture of neck, unspecified, initial encounter: Secondary | ICD-10-CM | POA: Diagnosis present

## 2014-07-09 DIAGNOSIS — D62 Acute posthemorrhagic anemia: Secondary | ICD-10-CM | POA: Diagnosis present

## 2014-07-09 DIAGNOSIS — M4852XD Collapsed vertebra, not elsewhere classified, cervical region, subsequent encounter for fracture with routine healing: Secondary | ICD-10-CM

## 2014-07-09 DIAGNOSIS — S06354D Traumatic hemorrhage of left cerebrum with loss of consciousness of 6 hours to 24 hours, subsequent encounter: Secondary | ICD-10-CM

## 2014-07-09 MED ORDER — ONDANSETRON HCL 4 MG/2ML IJ SOLN: 4.0000 mg | Freq: Four times a day (QID) | INTRAMUSCULAR | Status: DC | PRN

## 2014-07-09 MED ORDER — HYDROCERIN EX CREA
TOPICAL_CREAM | Freq: Two times a day (BID) | CUTANEOUS | Status: DC
  Administered 2014-07-09 – 2014-07-23 (×28): via TOPICAL
  Administered 2014-07-23: 1 via TOPICAL
  Administered 2014-07-24 (×2): via TOPICAL
  Administered 2014-07-25: 1 via TOPICAL
  Administered 2014-07-25 – 2014-08-05 (×23): via TOPICAL
  Administered 2014-08-06: 1 via TOPICAL
  Filled 2014-07-09 (×2): qty 113

## 2014-07-09 MED ORDER — PRO-STAT SUGAR FREE PO LIQD
30.0000 mL | Freq: Three times a day (TID) | ORAL | Status: DC
  Administered 2014-07-09 – 2014-07-10 (×2): 30 mL
  Filled 2014-07-09 (×5): qty 30

## 2014-07-09 MED ORDER — FREE WATER
150.0000 mL | Freq: Three times a day (TID) | Status: DC
Start: 2014-07-09 — End: 2014-07-10
  Administered 2014-07-09 – 2014-07-10 (×3): 150 mL

## 2014-07-09 MED ORDER — JEVITY 1.5 CAL/FIBER PO LIQD
1000.0000 mL | ORAL | Status: DC
  Administered 2014-07-09: 1000 mL
  Filled 2014-07-09 (×3): qty 1000

## 2014-07-09 MED ORDER — ACETAMINOPHEN 325 MG PO TABS
325.0000 mg | ORAL_TABLET | ORAL | Status: DC | PRN
  Administered 2014-07-18 – 2014-07-30 (×2): 650 mg via ORAL
  Filled 2014-07-09 (×2): qty 2

## 2014-07-09 MED ORDER — GUAIFENESIN-DM 100-10 MG/5ML PO SYRP: 5.0000 mL | ORAL_SOLUTION | Freq: Four times a day (QID) | ORAL | Status: DC | PRN

## 2014-07-09 MED ORDER — POTASSIUM CHLORIDE 20 MEQ/15ML (10%) PO SOLN
20.0000 meq | Freq: Two times a day (BID) | ORAL | Status: DC
  Administered 2014-07-09 – 2014-08-06 (×54): 20 meq
  Filled 2014-07-09 (×62): qty 15

## 2014-07-09 MED ORDER — BISACODYL 10 MG RE SUPP
10.0000 mg | Freq: Every day | RECTAL | Status: DC | PRN
  Filled 2014-07-09: qty 1

## 2014-07-09 MED ORDER — LIDOCAINE 5 % EX PTCH
1.0000 | MEDICATED_PATCH | CUTANEOUS | Status: DC
  Administered 2014-07-09 – 2014-07-12 (×4): 1 via TRANSDERMAL
  Filled 2014-07-09 (×5): qty 1

## 2014-07-09 MED ORDER — METOPROLOL TARTRATE 25 MG/10 ML ORAL SUSPENSION
5.0000 mg | Freq: Three times a day (TID) | ORAL | Status: DC
  Administered 2014-07-09 – 2014-07-22 (×50): 5 mg
  Administered 2014-07-22: 6.25 mg
  Administered 2014-07-22: 5 mg
  Administered 2014-07-23 (×2): 50 mg
  Administered 2014-07-23: 5 mg
  Administered 2014-07-23: 2.5 mg
  Administered 2014-07-24 – 2014-08-05 (×51): 5 mg
  Filled 2014-07-09 (×129): qty 2.5

## 2014-07-09 MED ORDER — SENNOSIDES 8.8 MG/5ML PO SYRP
10.0000 mL | ORAL_SOLUTION | Freq: Two times a day (BID) | ORAL | Status: DC
  Administered 2014-07-09 – 2014-08-05 (×55): 10 mL
  Filled 2014-07-09 (×61): qty 10

## 2014-07-09 MED ORDER — FAMOTIDINE 40 MG/5ML PO SUSR
20.0000 mg | Freq: Two times a day (BID) | ORAL | Status: DC
  Administered 2014-07-09 – 2014-08-06 (×56): 20 mg
  Filled 2014-07-09 (×64): qty 2.5

## 2014-07-09 MED ORDER — ALPRAZOLAM 0.25 MG PO TABS
0.2500 mg | ORAL_TABLET | Freq: Three times a day (TID) | ORAL | Status: DC | PRN
  Administered 2014-07-09 – 2014-07-22 (×11): 0.25 mg via ORAL
  Filled 2014-07-09 (×11): qty 1

## 2014-07-09 MED ORDER — OXYCODONE HCL 5 MG PO TABS
5.0000 mg | ORAL_TABLET | Freq: Two times a day (BID) | ORAL | Status: DC
Start: 2014-07-09 — End: 2014-08-06
  Administered 2014-07-09 – 2014-08-06 (×53): 5 mg
  Filled 2014-07-09 (×54): qty 1

## 2014-07-09 MED ORDER — ENOXAPARIN SODIUM 40 MG/0.4ML ~~LOC~~ SOLN
40.0000 mg | SUBCUTANEOUS | Status: DC
  Administered 2014-07-09 – 2014-08-05 (×28): 40 mg via SUBCUTANEOUS
  Filled 2014-07-09 (×29): qty 0.4

## 2014-07-09 MED ORDER — OXYCODONE HCL 5 MG PO TABS
5.0000 mg | ORAL_TABLET | ORAL | Status: DC | PRN
  Administered 2014-07-10 – 2014-07-12 (×2): 10 mg via ORAL
  Administered 2014-07-13: 5 mg via ORAL
  Administered 2014-07-14 – 2014-07-16 (×4): 10 mg via ORAL
  Administered 2014-07-17: 5 mg via ORAL
  Administered 2014-07-17: 10 mg via ORAL
  Administered 2014-07-20: 5 mg via ORAL
  Administered 2014-07-22 – 2014-07-23 (×2): 10 mg via ORAL
  Administered 2014-07-26: 5 mg via ORAL
  Administered 2014-07-28 – 2014-07-29 (×2): 10 mg via ORAL
  Filled 2014-07-09 (×2): qty 2
  Filled 2014-07-09 (×2): qty 1
  Filled 2014-07-09 (×2): qty 2
  Filled 2014-07-09 (×2): qty 1
  Filled 2014-07-09 (×2): qty 2
  Filled 2014-07-09 (×2): qty 1
  Filled 2014-07-09 (×5): qty 2

## 2014-07-09 MED ORDER — FLEET ENEMA 7-19 GM/118ML RE ENEM: 1.0000 | ENEMA | Freq: Once | RECTAL | Status: AC | PRN

## 2014-07-09 MED ORDER — ONDANSETRON HCL 4 MG PO TABS: 4.0000 mg | ORAL_TABLET | Freq: Four times a day (QID) | ORAL | Status: DC | PRN

## 2014-07-09 MED ORDER — TRAZODONE HCL 50 MG PO TABS
25.0000 mg | ORAL_TABLET | Freq: Every evening | ORAL | Status: DC | PRN
  Administered 2014-07-10 – 2014-08-01 (×7): 50 mg via ORAL
  Filled 2014-07-09 (×8): qty 1

## 2014-07-09 MED ORDER — ALUM & MAG HYDROXIDE-SIMETH 200-200-20 MG/5ML PO SUSP: 30.0000 mL | ORAL | Status: DC | PRN

## 2014-07-09 MED ORDER — DIPHENHYDRAMINE HCL 12.5 MG/5ML PO ELIX: 12.5000 mg | ORAL_SOLUTION | Freq: Four times a day (QID) | ORAL | Status: DC | PRN
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
## 2014-07-09 NOTE — Progress Notes (Signed)
Right upper arm single lumen PICC removed per order. Measured 40 cm. Patient to remain supine for 30 mins. After removal to avoid complications. Pressure dressing to remain in place for 24 hrs.. RN aware. Patient tolerated procedure well. Eliot FordSarah Kainoa Swoboda RN [facility].
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
## 2014-07-09 NOTE — Progress Notes (Signed)
[facility] with RN and family at bedside. Patient has #6 Shiley disposable cuff less trach on 28 % trach collar. Patient has hard cervical collar on at all times. Patient has minimal complaints of pain. Patient resting in bed with call bell at side. Oriented to fall prevention plan, rehab safety plan, and rehab process. Will continue to monitor.

## 2014-07-10 ENCOUNTER — Inpatient Hospital Stay (HOSPITAL_COMMUNITY): Payer: Medicare Other

## 2014-07-10 ENCOUNTER — Inpatient Hospital Stay (HOSPITAL_COMMUNITY): Payer: Medicare Other | Admitting: Occupational Therapy

## 2014-07-10 ENCOUNTER — Inpatient Hospital Stay (HOSPITAL_COMMUNITY): Payer: Medicare Other | Admitting: *Deleted

## 2014-07-10 ENCOUNTER — Inpatient Hospital Stay (HOSPITAL_COMMUNITY): Payer: Medicare Other | Admitting: Speech Pathology

## 2014-07-10 DIAGNOSIS — S129XXS Fracture of neck, unspecified, sequela: Secondary | ICD-10-CM

## 2014-07-10 DIAGNOSIS — J189 Pneumonia, unspecified organism: Secondary | ICD-10-CM

## 2014-07-10 LAB — CBC WITH DIFFERENTIAL/PLATELET
BASOS PCT: 1 % (ref 0–1)
Basophils Absolute: 0 10*3/uL (ref 0.0–0.1)
EOS PCT: 1 % (ref 0–5)
Eosinophils Absolute: 0.1 10*3/uL (ref 0.0–0.7)
HEMATOCRIT: 35 % — AB (ref 39.0–52.0)
Hemoglobin: 11.5 g/dL — ABNORMAL LOW (ref 13.0–17.0)
Lymphocytes Relative: 15 % (ref 12–46)
Lymphs Abs: 0.9 10*3/uL (ref 0.7–4.0)
MCH: 28.8 pg (ref 26.0–34.0)
MCHC: 32.9 g/dL (ref 30.0–36.0)
MCV: 87.7 fL (ref 78.0–100.0)
MONO ABS: 0.5 10*3/uL (ref 0.1–1.0)
MONOS PCT: 8 % (ref 3–12)
NEUTROS PCT: 75 % (ref 43–77)
Neutro Abs: 4.6 10*3/uL (ref 1.7–7.7)
Platelets: 427 10*3/uL — ABNORMAL HIGH (ref 150–400)
RBC: 3.99 MIL/uL — ABNORMAL LOW (ref 4.22–5.81)
RDW: 14.5 % (ref 11.5–15.5)
WBC: 6.1 10*3/uL (ref 4.0–10.5)

## 2014-07-10 LAB — COMPREHENSIVE METABOLIC PANEL
ALBUMIN: 2.8 g/dL — AB (ref 3.5–5.2)
ALT: 14 U/L (ref 0–53)
ANION GAP: 14 (ref 5–15)
AST: 19 U/L (ref 0–37)
Alkaline Phosphatase: 144 U/L — ABNORMAL HIGH (ref 39–117)
BUN: 15 mg/dL (ref 6–23)
CALCIUM: 9.4 mg/dL (ref 8.4–10.5)
CO2: 28 mEq/L (ref 19–32)
CREATININE: 0.45 mg/dL — AB (ref 0.50–1.35)
Chloride: 95 mEq/L — ABNORMAL LOW (ref 96–112)
GFR calc Af Amer: >90 mL/min (ref >90)
GFR calc non Af Amer: >90 mL/min (ref >90)
Glucose, Bld: 105 mg/dL — ABNORMAL HIGH (ref 70–99)
Potassium: 3.7 mEq/L (ref 3.7–5.3)
Sodium: 137 mEq/L (ref 137–147)
TOTAL PROTEIN: 7.1 g/dL (ref 6.0–8.3)
Total Bilirubin: 0.4 mg/dL (ref 0.3–1.2)

## 2014-07-10 MED ORDER — FREE WATER
250.0000 mL | Freq: Four times a day (QID) | Status: DC
  Administered 2014-07-10 – 2014-07-15 (×21): 250 mL

## 2014-07-10 MED ORDER — JEVITY 1.5 CAL/FIBER PO LIQD
1000.0000 mL | ORAL | Status: DC
  Administered 2014-07-10 – 2014-07-15 (×4): 1000 mL
  Filled 2014-07-10 (×10): qty 1000

## 2014-07-10 MED ORDER — PRO-STAT SUGAR FREE PO LIQD
30.0000 mL | Freq: Two times a day (BID) | ORAL | Status: DC
  Administered 2014-07-10 – 2014-07-15 (×10): 30 mL
  Filled 2014-07-10 (×13): qty 30
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
## 2014-07-10 NOTE — Progress Notes (Signed)
Trach d/c per order.  Pt tol well, no distress noted.  RN aware, dressing applied to stoma site.  Sat 97% on room air, HR 92

## 2014-07-10 NOTE — Progress Notes (Signed)
Orthopedic Tech Progress Note Patient Details:  Hunter MartesDwight Myers 04/15/1946 [id number]  Ortho Devices Type of Ortho Device: Abdominal binder Ortho Device/Splint Interventions: Casandra DoffingOrdered   Satin Boal Craig 07/10/2014, 5:37 PM

## 2014-07-10 NOTE — Progress Notes (Signed)
Continuous pulse ox alarm sounded. Entered room and patient pulled out trach and took off hard collar. Paged respiratory therapy immediately. Reinserted trach into stoma using obturator at bedside. Tolerated well. No distress noted. O2 sat > 95%. Respiratory at bedside and used CO2 detector to assess placement and placement was verified. Vital signs stable. Cervical collar reapplied. Mittens applied. Patient educated and reminded not to pull at trach or other tubes. Jacalyn LefevreEunice Thomas NP notified. Will continue to monitor. Patient resting quietly. No distress noted.

## 2014-07-10 NOTE — Progress Notes (Signed)
Occupational Therapy Session Note  Patient Details  Name: Hunter MartesDwight Gowens MRN: [id number] Date of Birth: 02/04/1946  Today's Date: 07/10/2014 OT Individual Time: [id number] OT Individual Time Calculation (min): 15 min    Short Term Goals: Week 1:  OT Short Term Goal 1 (Week 1): Pt will complete toilet transfer with max assist +1 OT Short Term Goal 2 (Week 1): Pt will complete LB dressing with max assist  OT Short Term Goal 3 (Week 1): Pt will consistently be oriented x3 with min cues OT Short Term Goal 4 (Week 1): Pt will complete bathing with mod assist for washing body parts and max assist sit<>stand  Skilled Therapeutic Interventions/Progress Updates:  Pt received in bed asleep, but easily aroused.  Pt with decreased interest in participating in treatment session reporting fatigue.  However fidgeting in bed, therefore engaged in bed mobility with focus on log rolling Rt and Lt then repositioned in sidelying.  Educated on back precautions and attempted orientation with awareness of situation.  Pt began fidgeting with collar, reapplied hand mitts to which pt attempted to remove. Pt reports frustration and fatigue requesting to end session early.  Therapy Documentation Precautions:  Precautions Precautions: Fall, Cervical, Back Precaution Comments: trach, NPO with PEG Required Braces or Orthoses: Cervical Brace, Spinal Brace Cervical Brace: Hard collar Spinal Brace: Thoracolumbosacral orthotic, Applied in supine position Restrictions Weight Bearing Restrictions: No General: General OT Amount of Missed Time: 15 Minutes Vital Signs: Therapy Vitals Temp: 97.7 F (36.5 C) Temp Source: Oral Pulse Rate: 85 Resp: 20 BP: 126/80 mmHg Patient Position (if appropriate): Lying Oxygen Therapy SpO2: 100 % O2 Device: Not Delivered FiO2 (%): 21 % Pain:  Pt with no c/o pain  See FIM for current functional status  Therapy/Group: Individual Therapy  Rosalio LoudHOXIE, Marsha Gundlach 07/10/2014, 3:22 PM
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
## 2014-07-10 NOTE — Progress Notes (Signed)
Patient information reviewed and entered into eRehab System by Becky Philipp Callegari, covering PPS coordinator. Information including medical coding and functional independence measure will be reviewed and updated through discharge.  Per nursing, patient was given "Data Collection Information Summary for Patients in Inpatient Rehabilitation Facilities with attached Privacy Act Statement Health Care Records" upon admission.     

## 2014-07-10 NOTE — Progress Notes (Signed)
Recreational Therapy Session Note  Patient Details  Name: Hunter Myers MRN: [id number] Date of Birth: [birth year] Today's Date: 07/10/2014  Met with pt briefly today, leisure screen initiated.  Full eval to be completed early next week. [name] 07/10/2014, 4:15 PM
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
## 2014-07-11 ENCOUNTER — Inpatient Hospital Stay (HOSPITAL_COMMUNITY): Payer: Medicare Other | Admitting: Physical Therapy

## 2014-07-11 ENCOUNTER — Inpatient Hospital Stay (HOSPITAL_COMMUNITY): Payer: Medicare Other | Admitting: Occupational Therapy

## 2014-07-11 ENCOUNTER — Inpatient Hospital Stay (HOSPITAL_COMMUNITY): Payer: Medicare Other | Admitting: Speech Pathology

## 2014-07-11 DIAGNOSIS — S069X0D Unspecified intracranial injury without loss of consciousness, subsequent encounter: Secondary | ICD-10-CM

## 2014-07-11 DIAGNOSIS — S129XXD Fracture of neck, unspecified, subsequent encounter: Secondary | ICD-10-CM

## 2014-07-11 NOTE — Plan of Care (Signed)
Problem: RH BOWEL ELIMINATION Goal: RH STG MANAGE BOWEL WITH ASSISTANCE STG Manage Bowel with max Assistance.  Outcome: Progressing  Problem: RH BLADDER ELIMINATION Goal: RH STG MANAGE BLADDER WITH ASSISTANCE STG Manage Bladder With max Assistance  Outcome: Progressing  Problem: RH SKIN INTEGRITY Goal: RH STG SKIN FREE OF INFECTION/BREAKDOWN Remain free form skin breakdown while on Rehab with max assist  Outcome: Progressing
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
## 2014-07-11 NOTE — Progress Notes (Signed)
Speech Language Pathology Daily Session Note  Patient Details  Name: Hunter Myers MRN: [id number] Date of Birth: 06/29/1946  Today's Date: 07/11/2014 SLP Individual Time: 1345-1430 SLP Individual Time Calculation (min): 45 min  Short Term Goals: Week 1: SLP Short Term Goal 1 (Week 1): Patient will demonstrate sustained attention to a functional and familiar task for 5 minutes with Max A multimodal cues cues.  SLP Short Term Goal 2 (Week 1): Patient will orient to time, place and situation with Min A multimodal cues.  SLP Short Term Goal 3 (Week 1): Patient will identify 2 physical and 2 cognitive deficits with Max A multimodal cues.  SLP Short Term Goal 4 (Week 1): Patient will peform pharyngeal strengthening exercises with Max A multimodal cues.  SLP Short Term Goal 5 (Week 1): Patient will consume trials of ice chips with minimal overt s/s of aspiration with Max A mutlimodal cues.  SLP Short Term Goal 6 (Week 1): Patient will utilize an increased vocal intensity at the phrase level with Max A multimodal cues.   Skilled Therapeutic Interventions: Skilled treatment session focused on cognitive goals. Upon arrival, patient was sitting upright in the wheelchair at the RN station with his neck brace rotated 90 degrees without a dressing on his stoma. RN made aware and repositioned collar and applied dressing to stoma.  SLP facilitated session by providing Mod A multimodal cues for orientation to place, time and situation and Max A multimodal cues for intellectual awareness of cognitive deficits with improved intellectual awareness of his physical deficits, however, patient perseverative on removing braces throughout the session. Patient participated in an oral reading task with 100% accuracy and was 100% accurate with reading comprehension at the paragraph level.  Patient also was 100% accurate with a basic written expression task. Patient participated in a basic money management task and required Max  A multimodal cues for functional problem solving and recall and demonstrated emergent awareness into difficulty of task with Min A question cues.  [name] voice appeared more dysphonic today, suspect due to open stoma and inability to apply pressure to stoma during verbal expression at this time. Patient handed off to PT. Continue with current plan of care.    FIM:  Comprehension Comprehension: 3-Understands basic 50 - 74% of the time/requires cueing 25 - 50%  of the time Expression Expression: 3-Expresses basic 50 - 74% of the time/requires cueing 25 - 50% of the time. Needs to repeat parts of sentences. Social Interaction Social Interaction: 3-Interacts appropriately 50 - 74% of the time - May be physically or verbally inappropriate. Problem Solving Problem Solving: 1-Solves basic less than 25% of the time - needs direction nearly all the time or does not effectively solve problems and may need a restraint for safety Memory Memory: 2-Recognizes or recalls 25 - 49% of the time/requires cueing 51 - 75% of the time  Pain Pain Assessment Pain Assessment: No/denies pain  Therapy/Group: Individual Therapy  Rajean Desantiago 07/11/2014, 3:35 PM

## 2014-07-11 NOTE — Progress Notes (Signed)
Physical Therapy Session Note  Patient Details  Name: Hunter MartesDwight Myers MRN: [id number] Date of Birth: 10/04/1945  Today's Date: 07/11/2014 PT Individual Time: 0930-1015 PT Individual Time Calculation (min): 45 min   Short Term Goals: Week 1:  PT Short Term Goal 1 (Week 1): Patient will perform bed mobility with use of hospital bed functions and modA. PT Short Term Goal 2 (Week 1): Patient will perform functional transfers with maxA x1. PT Short Term Goal 3 (Week 1): Patient will perform gait training 5950' with +2 assist. PT Short Term Goal 4 (Week 1): Patient will negotiate 2 steps with B handrails and +2 assist. PT Short Term Goal 5 (Week 1): Patient will demonstrate carry over within session with mobility techniques with mod cues.  Skilled Therapeutic Interventions/Progress Updates:  Pt was seen sitting up in tilt in space w/c in the am. Pt transported to rehab gym. Treatment in gym focused on LE strengthening, NMR, balance, and sit to stand transfers. Pt performed multiple sit to stand transfers in parallel bars with mod A. Pt tolerated standing about 15 to 30 seconds each time with mod to max A. While standing focused on weight shifting, NMR, and standing posture. Pt was able to ambulate 3 feet in parallel bars with max A and multiple verbal cues. Pt performed LE exercises in w/c for strengthening B LEs. Following treatment, pt returned to nurse's station with quick release belt in place.    Therapy Documentation Precautions:  Precautions Precautions: Fall, Cervical, Back Precaution Comments: trach, NPO with PEG Required Braces or Orthoses: Cervical Brace, Spinal Brace Cervical Brace: Hard collar Spinal Brace: Thoracolumbosacral orthotic, Applied in supine position Restrictions Weight Bearing Restrictions: No General:   Pain: No c/o pain.   Locomotion : Ambulation Ambulation/Gait Assistance: 2: Max assist   See FIM for current functional status  Therapy/Group: Individual  Therapy  Hunter Myers, Hunter Myers 07/11/2014, 12:37 PM

## 2014-07-11 NOTE — Progress Notes (Signed)
Occupational Therapy Session Note  Patient Details  Name: Hunter MartesDwight Myers MRN: [id number] Date of Birth: 09/09/1945  Today's Date: 07/11/2014 OT Individual Time: [id number] OT Individual Time Calculation (min): 60 min    Short Term Goals: Week 1:  OT Short Term Goal 1 (Week 1): Pt will complete toilet transfer with max assist +1 OT Short Term Goal 2 (Week 1): Pt will complete LB dressing with max assist  OT Short Term Goal 3 (Week 1): Pt will consistently be oriented x3 with min cues OT Short Term Goal 4 (Week 1): Pt will complete bathing with mod assist for washing body parts and max assist sit<>stand  Skilled Therapeutic Interventions/Progress Updates:  Upon entering the room, pt in bed with head elevated 31 degrees. Pt with no c/o pain this session. Pt requesting to have wrist restraints removed which they were for trial during session. LB bathing performed in supine and TLSO brace donned in supine. Pt transferred with stand pivot of max A to chair of 1 and second helper holding wheelchair for safety. Once seated in wheelchair grooming performed at sink side with assist for brushing hair and teeth. Pt required multiple rest breaks secondary to fatigue with O2 stats remaining above 90% on room air. Quick release belt donned and pt seated in wheelchair at RN station for safety.  Therapy Documentation Precautions:  Precautions Precautions: Fall, Cervical, Back Precaution Comments: trach, NPO with PEG Required Braces or Orthoses: Cervical Brace, Spinal Brace Cervical Brace: Hard collar Spinal Brace: Thoracolumbosacral orthotic, Applied in supine position Restrictions Weight Bearing Restrictions: No   See FIM for current functional status  Therapy/Group: Individual Therapy  Lowella Gripittman, Andreya Lacks L 07/11/2014, 12:32 PM

## 2014-07-11 NOTE — Progress Notes (Signed)
Physical Therapy Session Note  Patient Details  Name: Hunter MartesDwight Myers MRN: [id number] Date of Birth: 11/23/1945  Today's Date: 07/11/2014 PT Individual Time: 1430-1500 PT Individual Time Calculation (min): 30 min   Short Term Goals: Week 1:  PT Short Term Goal 1 (Week 1): Patient will perform bed mobility with use of hospital bed functions and modA. PT Short Term Goal 2 (Week 1): Patient will perform functional transfers with maxA x1. PT Short Term Goal 3 (Week 1): Patient will perform gait training 2850' with +2 assist. PT Short Term Goal 4 (Week 1): Patient will negotiate 2 steps with B handrails and +2 assist. PT Short Term Goal 5 (Week 1): Patient will demonstrate carry over within session with mobility techniques with mod cues.  Skilled Therapeutic Interventions/Progress Updates:  Pt was seen in the gym in the pm. Treatment focused on sit to stand transfers as well as NMR once standing. Pt performed multiple transfers in parallel bars with mod A and verbal cues. While standing focused on weight shifting, unilateral stance and upright posture.    Therapy Documentation Precautions:  Precautions Precautions: Fall, Cervical, Back Precaution Comments: trach, NPO with PEG Required Braces or Orthoses: Cervical Brace, Spinal Brace Cervical Brace: Hard collar Spinal Brace: Thoracolumbosacral orthotic, Applied in supine position Restrictions Weight Bearing Restrictions: No General:   Pain: No c/o pain.    Locomotion : Ambulation Ambulation/Gait Assistance: 2: Max assist   See FIM for current functional status  Therapy/Group: Individual Therapy  Rayford HalstedMitchell, [name] 07/11/2014, 3:14 PM

## 2014-07-12 ENCOUNTER — Inpatient Hospital Stay (HOSPITAL_COMMUNITY): Payer: Medicare Other | Admitting: *Deleted

## 2014-07-12 NOTE — Plan of Care (Signed)
Problem: RH BOWEL ELIMINATION Goal: RH STG MANAGE BOWEL WITH ASSISTANCE STG Manage Bowel with max Assistance.  Outcome: Progressing  Problem: RH BLADDER ELIMINATION Goal: RH STG MANAGE BLADDER WITH ASSISTANCE STG Manage Bladder With max Assistance  Outcome: Not Progressing Condom cath day and hs

## 2014-07-12 NOTE — Progress Notes (Signed)
Patient slept all night after a bath was given around 2030. Patient was agitated prior to getting a bath and pulled C-collar off. Patient has not pulled at C-collar and no unsafe behaviors noted since. Will continue to monitor and cont plan of care. Ronni Osterberg, Phill MutterMelissa Rebecca

## 2014-07-12 NOTE — Progress Notes (Signed)
Occupational Therapy Session Note  Patient Details  Name: Hunter Myers Rita MRN: [id number] Date of Birth: 01/21/1946  Today's Date: 07/12/2014 OT Individual Time:  - 1530-1615  (45 min)      Short Term Goals: Week 1:  OT Short Term Goal 1 (Week 1): Pt will complete toilet transfer with max assist +1 OT Short Term Goal 2 (Week 1): Pt will complete LB dressing with max assist  OT Short Term Goal 3 (Week 1): Pt will consistently be oriented x3 with min cues OT Short Term Goal 4 (Week 1): Pt will complete bathing with mod assist for washing body parts and max assist sit<>stand        Skilled Therapeutic Interventions/Progress Updates:    .Addressed  Bed mobility, sit to stand, sitting balance, standing balance, transfers, standing tolerance, wc mobility.  Pt. Lying in bed.  Came to EOB with mod assist.  Sat for 10 minutes with work on midline control and balance.  Did sit to stand x3 with total assist +2, pt= 40 % of work.  Transferred to wc with SPT and max assist for balance.  Stood at  [employer] for 1 minute  And combed hair.  Provided cues for posture alignment.  Propelled wc to nursing station with mod assist.  I have been unable to reach this patient by phone.  Left pt in wc with safety belt on  Therapy Documentation Precautions:  Precautions Precautions: Fall, Cervical, Back Precaution Comments: trach, NPO with PEG Required Braces or Orthoses: Cervical Brace, Spinal Brace Cervical Brace: Hard collar Spinal Brace: Thoracolumbosacral orthotic, Applied in supine position Restrictions Weight Bearing Restrictions: No       Pain:  610   Muscles in stomach, legs, chest         :    See FIM for current functional status  Therapy/Group: Individual Therapy  Humberto Sealsdwards, Dicky Boer J 07/12/2014, 4:19 PM

## 2014-07-12 NOTE — Progress Notes (Signed)
Patient ID: Dayton MartesDwight Almas, male   DOB: 12/08/1945, 68 y.o.   MRN: [id number]  Patient ID: Dayton MartesDwight Filippi, male   DOB: 12/24/1945, 68 y.o.   MRN: [id number]   [facility] PHYSICAL MEDICINE & REHABILITATION     PROGRESS NOTE   07/12/14.  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  Past Medical History  Diagnosis Date  . SAH (subarachnoid hemorrhage)   . SDH (subdural hematoma)   . Pulmonary contusion   . Closed fracture of right orbit   . Closed fracture of atlas   . Fracture of C5 vertebra, closed   . Fracture of occipital condyle   . Contusion of spleen   . Closed T6 spinal fracture   . Closed fracture of seventh thoracic vertebra   . Closed T8 spinal fracture   . Acute respiratory failure   . Ventilator dependence   . Thrombocytopenia   . Fracture of lumbar spine     treated with brace.  . Ankle fracture, right     treated with cast  . Anxiety disorder      Objective: Vital Signs: Blood pressure 135/71, pulse 77, temperature 98 F (36.7 C), temperature source Oral, resp. rate 19, height 5\' 10"  (1.778 m), weight 117 lb 4.6 oz (53.2 kg), SpO2 100 %. No results found.  Recent Labs  07/10/14 0500  WBC 6.1  HGB 11.5*  HCT 35.0*  PLT 427*    Recent Labs  07/10/14 0500  NA 137  K 3.7  CL 95*  GLUCOSE 105*  BUN 15  CREATININE 0.45*  CALCIUM 9.4   CBG (last 3)  No results for input(s): GLUCAP in the last 72 hours.  Wt Readings from Last 3 Encounters:  07/12/14 117 lb 4.6 oz (53.2 kg)  07/09/14 129 lb (58.514 kg)    Patient Vitals for the past 24 hrs:  BP Temp Temp src Pulse Resp SpO2 Weight  07/12/14 0614 135/71 mmHg 98 F (36.7 C) Oral 77 19 100 % -  07/12/14 0500 - - - - - - 117 lb 4.6 oz (53.2 kg)  07/11/14 2205 (!) 146/84 mmHg - - 76 - - -  07/11/14 1512 (!) 150/70 mmHg 97.9 F (36.6 C) Oral 95 18 100 % -      Intake/Output Summary (Last 24 hours) at 07/12/14 0905 Last data filed at 07/12/14 0618  Gross per 24 hour  Intake      0 ml  Output    950 ml  Net   -950 ml     Physical Exam:   Constitutional: He appears well-developed. He appears  Chronically ill;  Cervical collar in place.    HENT:   Head: Normocephalic and atraumatic.  Eyes: Conjunctivae are normal. Pupils are equal, round, and reactive to light.  Neck:  C collar in place Cardiovascular: Regular rhythm.  Respiratory: Effort normal and breath sounds normal. No respiratory distress. He has no wheezes.  GI: Soft. Bowel sounds are normal. He exhibits no distension.  PEG site clean and dry. Musculoskeletal: He exhibits no edema.  GU condom cath in place Neurological: He is alert.   3+ prox to 4/5 distally in UE's. LE: Left HF 3/5. RHF 2/5. Distally 3 to 4/5 Skin: Skin is warm and dry.    Medical Problem List and Plan: 1. Functional deficits secondary to TBI, cervical and thoracic spine fractures.  2. DVT  Prophylaxis/Anticoagulation: Pharmaceutical: Lovenox 3. Pain Management: Will continue oxycodone bid with prn doses as needed. Lidocain patch for neck pain. 4. H/o anxiety disorder/Mood: Will continue xanax prn for now. Patient has poor insight and lacks awareness of deficits. LCSW to follow along for support and evaluation when indicated.  5. Neuropsych: This patient is not capable of making decisions on his own behalf. Will renew soft restraints 6. Skin/Wound Care: Pressure relief measures. Continue air mattress overlay. Added Eucerin cream for dry skin bilateral feet.  7. Fluids/Electrolytes/Nutrition: Monitor I/O. Electrolytes/bun/cr all look reasonable  -continue TF  -rd consult 8. ABLA:  hgb 11.5   LOS (Days) 3 A FACE TO FACE EVALUATION WAS PERFORMED  Rogelia BogaKWIATKOWSKI,El Pile FRANK 07/12/2014 9:05 AM

## 2014-07-13 ENCOUNTER — Inpatient Hospital Stay (HOSPITAL_COMMUNITY): Payer: Medicare Other | Admitting: Speech Pathology

## 2014-07-13 ENCOUNTER — Inpatient Hospital Stay (HOSPITAL_COMMUNITY): Payer: Medicare Other

## 2014-07-13 MED ORDER — LIDOCAINE 5 % EX PTCH
1.0000 | MEDICATED_PATCH | Freq: Every day | CUTANEOUS | Status: DC
  Administered 2014-07-14 – 2014-08-06 (×24): 1 via TRANSDERMAL
  Filled 2014-07-13 (×32): qty 1

## 2014-07-13 NOTE — Progress Notes (Addendum)
Physical Therapy Session Note  Patient Details  Name: Hunter Myers MRN: [id number] Date of Birth: 12/16/1945  Today's Date: 07/13/2014 PT Individual Time: 1415-1500 PT Individual Time Calculation (min): 45 min   Session 2 Time: 1630-1700 Time Calculation (min): 30 min  Short Term Goals: Week 1:  PT Short Term Goal 1 (Week 1): Patient will perform bed mobility with use of hospital bed functions and modA. PT Short Term Goal 2 (Week 1): Patient will perform functional transfers with maxA x1. PT Short Term Goal 3 (Week 1): Patient will perform gait training 2850' with +2 assist. PT Short Term Goal 4 (Week 1): Patient will negotiate 2 steps with B handrails and +2 assist. PT Short Term Goal 5 (Week 1): Patient will demonstrate carry over within session with mobility techniques with mod cues.  Skilled Therapeutic Interventions/Progress Updates:    Session 1: Pt received supine in bed, agreeable to participate in therapy. Pt found to be incontinent of urine due to condom cath coming loose. Pt missed 15 minutes for nursing care replacing catheter. Rolled L/R to change brief w/ ModA. Moved supine>sit w/ ModA to manage RLE off bed and provide assist to come up to sitting. SPT bed>w/c w/ MaxA of 1, max VC's for sequencing and foot placement. Tranpsorted to gym via w/c at [employer]. In parallel bars worked on sit<>stands, standing balance, pre-gait activities and NMR for BLE through forced use. Pt moved sit<>stand w/ MinA when allowed to pull on bars, then Mod-MaxA to remain standing due to buckling in R knee. During weight shifting and stepping forward/back pt able to intermittently fire quad to stabilize R knee w/ verbal cueing. Pt transported to nursing station, left seated in TIS w/c w/ SLP present to provide supervision. PT tightened pt's cervical collar for increased comfort and protection. Pt educated on importance of cervical collar to allow healing, pt able to teach back reasons for keeping on cervical  collar 10 and 20 minutes after educated on it.   Session 2: Pt received seated in TIS w/c at nurse's station, agreeable to participate in therapy. Pt transported to rehab gym via w/c. Blocked practice for SPT w/c<>mat table 5x w/ therapist guarding at hips, patient w/ hands on therapist shoulders for stability. Pt required assist to block R knee from buckling, mod verbal cueing for foot placement. Noted pt able to come sit<>stand w/ MinA then required Mod-Max to remain standing during transfer. Transported pt back to room via w/c. SPT w/ MaxA w/ LUE on therapist arm and HHA on R. Pt moved sit>supine w/ mild impulsivity, pt educated on waiting for assistance. Pt rolled L/R w/ MinA to doff TLSO and place disposable chuck pad. Pt left supine in bed w/ all needs within reach, RN notified of pt's position.   Therapy Documentation Precautions:  Precautions Precautions: Fall, Cervical, Back Precaution Comments: trach, NPO with PEG Required Braces or Orthoses: Cervical Brace, Spinal Brace Cervical Brace: Hard collar Spinal Brace: Thoracolumbosacral orthotic, Applied in supine position Restrictions Weight Bearing Restrictions: No General:   Vital Signs: Therapy Vitals Temp: 98 F (36.7 C) Temp Source: Oral Pulse Rate: 93 Resp: 18 BP: (!) 157/81 mmHg Patient Position (if appropriate): Lying Oxygen Therapy SpO2: 100 % O2 Device: Not Delivered Pain:   Reports discomfort w/ cervical collar. Mobility:   Locomotion :    Trunk/Postural Assessment :    Balance:   Exercises:   Other Treatments:    See FIM for current functional status  Therapy/Group: Individual Therapy  Hunter Myers, Hunter Myers  Hunter SpangleJess Davontay Myers, PT, DPT 07/13/2014, 7:51 AM

## 2014-07-13 NOTE — Discharge Instructions (Signed)
Inpatient Rehab Discharge Instructions  Dayton MartesDwight Tallarico Discharge date and time:    Activities/Precautions/ Functional Status: Activity: activity as tolerated Diet:  Wound Care:  Functional status:  ___ No restrictions     ___ Walk up steps independently ___ 24/7 supervision/assistance   ___ Walk up steps with assistance ___ Intermittent supervision/assistance  ___ Bathe/dress independently ___ Walk with walker     ___ Bathe/dress with assistance ___ Walk Independently    ___ Shower independently ___ Walk with assistance    ___ Shower with assistance ___ No alcohol     ___ Return to work/school ________  Special Instructions:    My questions have been answered and I understand these instructions. I will adhere to these goals and the provided educational materials after my discharge from the hospital.  Patient/Caregiver Signature _______________________________ Date __________  Clinician Signature _______________________________________ Date __________  Please bring this form and your medication list with you to all your follow-up doctor's appointments.

## 2014-07-13 NOTE — Progress Notes (Signed)
Speech Language Pathology Daily Session Note  Patient Details  Name: Hunter Myers MRN: [id number] Date of Birth: 10/04/1945  Today's Date: 07/13/2014 SLP Individual Time: 0905-1000 SLP Individual Time Calculation (min): 55 min  Short Term Goals: Week 1: SLP Short Term Goal 1 (Week 1): Patient will demonstrate sustained attention to a functional and familiar task for 5 minutes with Max A multimodal cues cues.  SLP Short Term Goal 2 (Week 1): Patient will orient to time, place and situation with Min A multimodal cues.  SLP Short Term Goal 3 (Week 1): Patient will identify 2 physical and 2 cognitive deficits with Max A multimodal cues.  SLP Short Term Goal 4 (Week 1): Patient will peform pharyngeal strengthening exercises with Max A multimodal cues.  SLP Short Term Goal 5 (Week 1): Patient will consume trials of ice chips with minimal overt s/s of aspiration with Max A mutlimodal cues.  SLP Short Term Goal 6 (Week 1): Patient will utilize an increased vocal intensity at the phrase level with Max A multimodal cues.   Skilled Therapeutic Interventions: Skilled treatment session focused on dysphagia and cognitive goals. Upon arrival, patient was awake while supine in bed. SLP facilitated session by providing thorough and extensive oral care to remove large amounts of dry secretions off the patient's soft and hard palate and tongue.  Patient consumed trials of ice chips and demonstrated a suspected delayed swallow initiation with multiple swallows and an intermittent throat clear indicative of penetration.  Suspect patient's cough/throat clear was too weak to expel possible penetrates.  Patient required total A for intellectual awareness of both his cognitive and physical deficits and was perseverative on removing his C-collar throughout the session. Patient left in bed with bed alarm on and mitts in place. Continue with current plan of care.     FIM:  Comprehension Comprehension Mode:  Auditory Comprehension: 3-Understands basic 50 - 74% of the time/requires cueing 25 - 50%  of the time Expression Expression: 3-Expresses basic 50 - 74% of the time/requires cueing 25 - 50% of the time. Needs to repeat parts of sentences. Social Interaction Social Interaction: 3-Interacts appropriately 50 - 74% of the time - May be physically or verbally inappropriate. Problem Solving Problem Solving: 1-Solves basic less than 25% of the time - needs direction nearly all the time or does not effectively solve problems and may need a restraint for safety Memory Memory: 2-Recognizes or recalls 25 - 49% of the time/requires cueing 51 - 75% of the time FIM - Eating Eating Activity: 1: Helper feeds patient (with trials)  Pain Pain Assessment Pain Assessment: No/denies pain   Therapy/Group: Individual Therapy  Mckinlee Dunk 07/13/2014, 4:00 PM

## 2014-07-13 NOTE — Progress Notes (Signed)
Speech Language Pathology Daily Session Note  Patient Details  Name: Hunter Myers MRN: [id number] Date of Birth: 08/08/1946  Today's Date: 07/13/2014 SLP Individual Time: 1500-1605 SLP Individual Time Calculation (min): 65 min  Short Term Goals: Week 1: SLP Short Term Goal 1 (Week 1): Patient will demonstrate sustained attention to a functional and familiar task for 5 minutes with Max A multimodal cues cues.  SLP Short Term Goal 2 (Week 1): Patient will orient to time, place and situation with Min A multimodal cues.  SLP Short Term Goal 3 (Week 1): Patient will identify 2 physical and 2 cognitive deficits with Max A multimodal cues.  SLP Short Term Goal 4 (Week 1): Patient will peform pharyngeal strengthening exercises with Max A multimodal cues.  SLP Short Term Goal 5 (Week 1): Patient will consume trials of ice chips with minimal overt s/s of aspiration with Max A mutlimodal cues.  SLP Short Term Goal 6 (Week 1): Patient will utilize an increased vocal intensity at the phrase level with Max A multimodal cues.   Skilled Therapeutic Interventions: Skilled treatment session focused on addressing cognitive and dysphagia goals. SLP facilitated session with set-up of oral care via suctioning and Min verbal cues to attend to and complete task.  SLP and facilitated session with trials of ice chips along with Mod multi-modal cues to complete multiple hard effortful swallows to address pharyngeal strengthening.  Patient exhibited no overt s/s of aspiration; however, volitional coughs remain weak with no suspeted glottic closure.  SLP also facilitated session by providing Min-Mod assist multimodal cues for orientation to place, time and situation and Max assist multimodal cues for intellectual awareness of cognitive and physical deficits.  Continue with current plan of care.    FIM:  Comprehension Comprehension: 3-Understands basic 50 - 74% of the time/requires cueing 25 - 50%  of the  time Expression Expression: 3-Expresses basic 50 - 74% of the time/requires cueing 25 - 50% of the time. Needs to repeat parts of sentences. Social Interaction Social Interaction: 3-Interacts appropriately 50 - 74% of the time - May be physically or verbally inappropriate. Problem Solving Problem Solving: 1-Solves basic less than 25% of the time - needs direction nearly all the time or does not effectively solve problems and may need a restraint for safety Memory Memory: 2-Recognizes or recalls 25 - 49% of the time/requires cueing 51 - 75% of the time FIM - Eating Eating Activity: 1: Helper feeds patient (with trials)  Pain Pain Assessment Pain Assessment: 0-10 Pain Score: 6  Pain Type: Acute pain Pain Location: Neck Pain Orientation: Right;Left Pain Descriptors / Indicators: Burning Pain Frequency: Occasional Pain Onset: Gradual Patients Stated Pain Goal: 3 Pain Intervention(s): RN made aware Multiple Pain Sites: No  Therapy/Group: Individual Therapy  Charlane FerrettiMelissa Jessy Calixte, M.A., CCC-SLP [phone]  Hunter Myers 07/13/2014, 3:25 PM

## 2014-07-13 NOTE — Progress Notes (Signed)
Occupational Therapy Session Note  Patient Details  Name: Hunter MartesDwight Miles MRN: [id number] Date of Birth: 11/13/1945  Today's Date: 07/13/2014 OT Individual Time: 1100-1200 OT Individual Time Calculation (min): 60 min    Short Term Goals: Week 1:  OT Short Term Goal 1 (Week 1): Pt will complete toilet transfer with max assist +1 OT Short Term Goal 2 (Week 1): Pt will complete LB dressing with max assist  OT Short Term Goal 3 (Week 1): Pt will consistently be oriented x3 with min cues OT Short Term Goal 4 (Week 1): Pt will complete bathing with mod assist for washing body parts and max assist sit<>stand  Skilled Therapeutic Interventions/Progress Updates: ADL-retraining with focus on improved safety awareness, attention, memory, adapted bathing/dressing skills, and functional transfers.   Pt received supine in bed requesting.  After mod assist to reorient to situation, pt requested assist with removal of cervical.   Pt was re-educated on risk of injury and verbalized understanding, retaining instruction for several minutes before repeating request.   Pt then participated in assisted bathing and completed washing his chest, arms, stomach and face with only setup assist while supine.   Pt lifted each leg as needed during lower body total assist with bathing; peri-area and buttocks were deferred d/t recent change of brief.   Pt dressed upper body but was unable to overcome barrier of cervical collar to pull shirt over head and down trunk.   Pt donned shorts this session, lifting pelvis off bed sufficiently to pull up shorts with both hands.   Pt required setup to don TLSO while supine and mod assist to rise to sit at edge of bed.   Pt transferred to w/c with max assist although contributing 70% while standing and 60% while lower to w/c.   Pt was escorted to RN station at end of session to allow replacement of hospital bed for low air-loss bed/mattress.      Therapy Documentation Precautions:   Precautions Precautions: Fall, Cervical, Back Precaution Comments: trach, NPO with PEG Required Braces or Orthoses: Cervical Brace, Spinal Brace Cervical Brace: Hard collar Spinal Brace: Thoracolumbosacral orthotic, Applied in supine position Restrictions Weight Bearing Restrictions: No  Vital Signs: Therapy Vitals Temp: 97.9 F (36.6 C) Temp Source: Oral Pulse Rate: 86 Resp: 18 BP: (!) 149/92 mmHg Patient Position (if appropriate): Lying Oxygen Therapy SpO2: 100 % O2 Device: Not Delivered   Pain: Pain Assessment Pain Assessment: No/denies pain Pain Score: 6  Pain Type: Acute pain Pain Location: Neck Pain Orientation: Right;Left Pain Descriptors / Indicators: Burning Pain Frequency: Occasional Pain Onset: Gradual Patients Stated Pain Goal: 3 Pain Intervention(s): RN made aware Multiple Pain Sites: No  See FIM for current functional status  Therapy/Group: Individual Therapy  Jolyn Deshmukh 07/13/2014, 3:51 PM

## 2014-07-13 NOTE — Plan of Care (Signed)
Problem: RH BOWEL ELIMINATION Goal: RH STG MANAGE BOWEL WITH ASSISTANCE STG Manage Bowel with max Assistance.  Outcome: Progressing  Problem: RH BLADDER ELIMINATION Goal: RH STG MANAGE BLADDER WITH ASSISTANCE STG Manage Bladder With max Assistance  Outcome: Progressing  Problem: RH SKIN INTEGRITY Goal: RH STG SKIN FREE OF INFECTION/BREAKDOWN Remain free form skin breakdown while on Rehab with max assist  Outcome: Progressing     
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
## 2014-07-14 ENCOUNTER — Inpatient Hospital Stay (HOSPITAL_COMMUNITY): Payer: Medicare Other

## 2014-07-14 ENCOUNTER — Inpatient Hospital Stay (HOSPITAL_COMMUNITY): Payer: Medicare Other | Admitting: Speech Pathology

## 2014-07-14 ENCOUNTER — Inpatient Hospital Stay (HOSPITAL_COMMUNITY): Payer: Medicare Other | Admitting: Physical Therapy

## 2014-07-14 DIAGNOSIS — S069X2S Unspecified intracranial injury with loss of consciousness of 31 minutes to 59 minutes, sequela: Secondary | ICD-10-CM

## 2014-07-14 NOTE — Progress Notes (Signed)
Physical Therapy Session Note  Patient Details  Name: Hunter Myers MRN: [id number] Date of Birth: 07/22/1946  Today's Date: 07/14/2014 PT Individual Time: 1530-1610 PT Individual Time Calculation (min): 40 min   Short Term Goals: Week 1:  PT Short Term Goal 1 (Week 1): Patient will perform bed mobility with use of hospital bed functions and modA. PT Short Term Goal 2 (Week 1): Patient will perform functional transfers with maxA x1. PT Short Term Goal 3 (Week 1): Patient will perform gait training 6350' with +2 assist. PT Short Term Goal 4 (Week 1): Patient will negotiate 2 steps with B handrails and +2 assist. PT Short Term Goal 5 (Week 1): Patient will demonstrate carry over within session with mobility techniques with mod cues.  Skilled Therapeutic Interventions/Progress Updates:    1:1. Pt received reclined in w/c at nurses' station wearing cervical collar, TLSO, and bilat soft mitts; agreeable to therapy. Session focused on dynamic sitting balance, postural control, and functional transfers. Transported pt to gym in w/c, where pt performed lateral scooting transfers from w/c<>mat table with mod Myers for stability, verbal cueing for technique. See below for NMR interventions. Per pt request to return to return to bed, transported pt back to room, where pt performed transferred from w/c>bed with max Myers (due to pt performing stand pivot transfer), manual facilitation of lateral weight shift to pivot. While doffing TLSO, pt performed bilat rolling with bed rail, verbal/tactile cueing for logroll technique. Departed with pt semi reclined in air fluidized bed with 4 rails up, bed alarm on, biilat soft mitts on, and all needs within reach. Requested soft call bell due to bilat soft mitts required to be on at all times, per safety plan.  Therapy Documentation Precautions:  Precautions Precautions: Fall, Cervical, Back Precaution Comments: trach, NPO with PEG Required Braces or Orthoses: Cervical  Brace, Spinal Brace Cervical Brace: Hard collar Spinal Brace: Thoracolumbosacral orthotic, Applied in supine position Restrictions Weight Bearing Restrictions: No Vital Signs: Therapy Vitals Temp: 97.9 F (36.6 C) Temp Source: Oral Pulse Rate: 79 Resp: 18 BP: 137/86 mmHg Patient Position (if appropriate): Lying Oxygen Therapy SpO2: 99 % O2 Device: Not Delivered Pain: Pain Assessment Pain Assessment: No/denies pain Pain Score: Asleep Pain Type: Acute pain Pain Location: Back Pain Orientation: Right;Left Pain Descriptors / Indicators: Aching Pain Onset: On-going Pain Intervention(s): Repositioned Multiple Pain Sites: No NMR: Neuromuscular Facilitation: Activity to increase grading;Activity to increase anterior-posterior weight shifting;Activity to increase lateral weight shifting;Activity to increase motor control;Other (comment) (Postural control). Seated on elevated mat table (no LE support), pt performed anterolateral reaching for horseshoes with R then LUE with min guard to mod Myers for postural control. Tossed horseshoes with RUE with min guard-min Myers to control forward flexion of trunk.   See FIM for current functional status  Therapy/Group: Individual Therapy  Hunter Myers, Hunter Myers 07/14/2014, 4:25 PM
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
## 2014-07-14 NOTE — Progress Notes (Signed)
Speech Language Pathology Daily Session Note  Patient Details  Name: Hunter Myers MRN: [id number] Date of Birth: 06/15/1946  Today's Date: 07/14/2014 SLP Individual Time: 1345-1430 SLP Individual Time Calculation (min): 45 min  Short Term Goals: Week 1: SLP Short Term Goal 1 (Week 1): Patient will demonstrate sustained attention to a functional and familiar task for 5 minutes with Max A multimodal cues cues.  SLP Short Term Goal 2 (Week 1): Patient will orient to time, place and situation with Min A multimodal cues.  SLP Short Term Goal 3 (Week 1): Patient will identify 2 physical and 2 cognitive deficits with Max A multimodal cues.  SLP Short Term Goal 4 (Week 1): Patient will peform pharyngeal strengthening exercises with Max A multimodal cues.  SLP Short Term Goal 5 (Week 1): Patient will consume trials of ice chips with minimal overt s/s of aspiration with Max A mutlimodal cues.  SLP Short Term Goal 6 (Week 1): Patient will utilize an increased vocal intensity at the phrase level with Max A multimodal cues.   Skilled Therapeutic Interventions: Pt was seen for skilled ST targeting goals for dysphagia and cognition.  Upon arrival, pt was reclined in bed, nurse tech and RN present.  Per report, pt was found to have removed C-collar and had been incontinent of urine prior to arrival.  SLP transferred pt to wheelchair with RN and nurse tech to maximize alertness and swallowing safety during therapeutic tasks.  SLP facilitated the session with trials of ice chips following thorough oral care to facilitate improved swallowing function.  Pt exhibited intermittent weak cough following trials, which SLP suspects to have been somewhat volitional in nature as pt verbalized attempts to clear pharyngeal secretions.   Pt able to orally expectorate a small amount of secretions x1.  SLP also facilitated the session with a basic card game targeting sustained attention and functional problem solving.  Pt  required overall mod-max assist multimodal cues to complete the abovementioned activity secondary to impulsivity and decreased working memory.  Pt also benefited from mod-max redirection from trying to remove his c-collar.  Pt left at nurse's station with bilateral hand mitts replaced.  Continue per current plan of care.    FIM:  Comprehension Comprehension Mode: Auditory Comprehension: 3-Understands basic 50 - 74% of the time/requires cueing 25 - 50%  of the time Expression Expression Mode: Verbal Expression: 3-Expresses basic 50 - 74% of the time/requires cueing 25 - 50% of the time. Needs to repeat parts of sentences. Social Interaction Social Interaction: 3-Interacts appropriately 50 - 74% of the time - May be physically or verbally inappropriate. Problem Solving Problem Solving: 2-Solves basic 25 - 49% of the time - needs direction more than half the time to initiate, plan or complete simple activities Memory Memory: 2-Recognizes or recalls 25 - 49% of the time/requires cueing 51 - 75% of the time FIM - Eating Eating Activity: 1: Helper feeds patient (with trials )  Pain Pain Assessment Pain Assessment: No/denies pain   Therapy/Group: Individual Therapy   Jackalyn LombardNicole Ethal Gotay, M.A. CCC-SLP   Najee Cowens, Melanee SpryNicole L 07/14/2014, 4:04 PM
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
## 2014-07-14 NOTE — Progress Notes (Signed)
Physical Therapy Session Note  Patient Details  Name: Hunter Myers MRN: [id number] Date of Birth: 06/28/1946  Today's Date: 07/14/2014 PT Individual Time: 1630-1700 PT Individual Time Calculation (min): 30 min   Short Term Goals: Week 1:  PT Short Term Goal 1 (Week 1): Patient will perform bed mobility with use of hospital bed functions and modA. PT Short Term Goal 2 (Week 1): Patient will perform functional transfers with maxA x1. PT Short Term Goal 3 (Week 1): Patient will perform gait training 8550' with +2 assist. PT Short Term Goal 4 (Week 1): Patient will negotiate 2 steps with B handrails and +2 assist. PT Short Term Goal 5 (Week 1): Patient will demonstrate carry over within session with mobility techniques with mod cues.  Skilled Therapeutic Interventions/Progress Updates:  1:1. Pt received supine in bed with cervical collar and mitts off. Pt stated, "I took them off because they were bothering me." Cervical collar donned and pt educated on importance. Despite verbalized understanding by pt, ongoing emphasis regarding intellectual awarenes by all therapies. Pt found to be incontinent of bladder, pt practiced B log rolling multiple times with min A and use of bed rail during total A for clean up. RN made aware of all.   Emphasis on supine B LE NMR to target isolated motor control. Pt performed 2x10 reps of: ankle pumps, quad sets, glute sets, as well as hip/knee flex and extension with manual facilitation as appropriate (R LE > L LE) for improved quality.  Pt left supine in bed with all needs in reach, mitts on and bed alarm on.     Therapy Documentation Precautions:  Precautions Precautions: Fall, Cervical, Back Precaution Comments: trach, NPO with PEG Required Braces or Orthoses: Cervical Brace, Spinal Brace Cervical Brace: Hard collar Spinal Brace: Thoracolumbosacral orthotic, Applied in supine position Restrictions Weight Bearing Restrictions: No  See FIM for current  functional status  Therapy/Group: Individual Therapy  [name], Yeilin Zweber S 07/14/2014, 8:27 PM

## 2014-07-14 NOTE — Progress Notes (Signed)
Physical Therapy Session Note  Patient Details  Name: Hunter Myers MRN: [id number] Date of Birth: 12/28/1945  Today's Date: 07/14/2014 PT Individual Time: 1000-1100 PT Individual Time Calculation (min): 60 min   Short Term Goals: Week 1:  PT Short Term Goal 1 (Week 1): Patient will perform bed mobility with use of hospital bed functions and modA. PT Short Term Goal 2 (Week 1): Patient will perform functional transfers with maxA x1. PT Short Term Goal 3 (Week 1): Patient will perform gait training 6250' with +2 assist. PT Short Term Goal 4 (Week 1): Patient will negotiate 2 steps with B handrails and +2 assist. PT Short Term Goal 5 (Week 1): Patient will demonstrate carry over within session with mobility techniques with mod cues.  Skilled Therapeutic Interventions/Progress Updates:  1:1. Pt received sitting in tilt-in-space w/c at nurses station, ready for therapy. Focus this session on functional transfers, w/c propulsion, ambulation, standing endurance and cognitive remediation. Pt req Ax2 persons to stand at start of session to use urinal, attempting to manage clothing with R UE as able. In standing, pt req intermittent cues for R knee extension due to progressive flexion during task. Pt req Ax2 persons during all t/f sit<>stand and SPT for emphasis on B weightshifting and attending to position of B LE.   Pt req mod A for w/c propulsion 20'x2 with B LE in forward/reverse directions, therapist manually facilitating improved advancement of R LE. Pt with fair tolerance to ambulation 25x1 with Ax2 persons via 3 musketeers technique, therapist manually facilitating improved quality of R LE advancement as well as multimodal cues to maintain widened BOS.   Pt with good tolerance to standing in standing frame x547min to target endurance while engaged in game of hangman, pt req mod cues for problem solving and mental flexibility of new task.   Pt req close(S)-min A for static sitting balance EOM at end  of session while engaged in discussion with Rec Therapist. Pt left sitting in tilt-in-space w/c at end of session w/ quick release belt in place.   Therapy Documentation Precautions:  Precautions Precautions: Fall, Cervical, Back Precaution Comments: trach, NPO with PEG Required Braces or Orthoses: Cervical Brace, Spinal Brace Cervical Brace: Hard collar Spinal Brace: Thoracolumbosacral orthotic, Applied in supine position Restrictions Weight Bearing Restrictions: No Pain: Pain Assessment Pain Assessment: No/denies pain Pain Score: 0-No pain  See FIM for current functional status  Therapy/Group: Individual Therapy and Co-Treatment with Rec Therapy  [name], [name] 07/14/2014, 12:05 PM
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
## 2014-07-14 NOTE — Care Management Note (Signed)
Inpatient Rehabilitation Center Individual Statement of Services  Patient Name:  Hunter Myers  Date:  07/10/2014  Welcome to the Inpatient Rehabilitation Center.  Our goal is to provide you with an individualized program based on your diagnosis and situation, designed to meet your specific needs.  With this comprehensive rehabilitation program, you will be expected to participate in at least 3 hours of rehabilitation therapies Monday-Friday, with modified therapy programming on the weekends.  Your rehabilitation program will include the following services:  Physical Therapy (PT), Occupational Therapy (OT), Speech Therapy (ST), 24 hour per day rehabilitation nursing, Therapeutic Recreaction (TR), Neuropsychology, Case Management (Social Worker), Rehabilitation Medicine, Nutrition Services and Pharmacy Services  Weekly team conferences will be held on Tuesdays to discuss your progress.  Your Social Worker will talk with you frequently to get your input and to update you on team discussions.  Team conferences with you and your family in attendance may also be held.  Expected length of stay: 21-24 days  Overall anticipated outcome: supervision/ min assist  Depending on your progress and recovery, your program may change. Your Social Worker will coordinate services and will keep you informed of any changes. Your Social Worker's name and contact numbers are listed  below.  The following services may also be recommended but are not provided by the Inpatient Rehabilitation Center:   Driving Evaluations  Home Health Rehabiltiation Services  Outpatient Rehabilitation Services    Arrangements will be made to provide these services after discharge if needed.  Arrangements include referral to agencies that provide these services.  Your insurance has been verified to be:  Medicare and BCBS [place]  Pertinent information will be shared with your doctor and your insurance  company.  Social Worker:  TurbevilleLucy Aidenn Myers, [place] [phone] or ([phone]) [phone]   Information discussed with and copy given to patient by: Amada JupiterHOYLE, Hunter Myers, 07/10/2014, 3:50 PM

## 2014-07-14 NOTE — Progress Notes (Signed)
Speech Language Pathology Daily Session Note  Patient Details  Name: Hunter Myers MRN: [id number] Date of Birth: 12/18/1945  Today's Date: 07/14/2014 SLP Individual Time: 1115-1200 SLP Individual Time Calculation (min): 45 min  Short Term Goals: Week 1: SLP Short Term Goal 1 (Week 1): Patient will demonstrate sustained attention to a functional and familiar task for 5 minutes with Max A multimodal cues cues.  SLP Short Term Goal 2 (Week 1): Patient will orient to time, place and situation with Min A multimodal cues.  SLP Short Term Goal 3 (Week 1): Patient will identify 2 physical and 2 cognitive deficits with Max A multimodal cues.  SLP Short Term Goal 4 (Week 1): Patient will peform pharyngeal strengthening exercises with Max A multimodal cues.  SLP Short Term Goal 5 (Week 1): Patient will consume trials of ice chips with minimal overt s/s of aspiration with Max A mutlimodal cues.  SLP Short Term Goal 6 (Week 1): Patient will utilize an increased vocal intensity at the phrase level with Max A multimodal cues.   Skilled Therapeutic Interventions: Skilled treatment session focused on dysphagia and cognitive goals. Upon arrival, patient was awake while sitting upright in the wheelchair. SLP facilitated session by providing supervision verbal cues for functional problem solving with use of the suction toothbrush during oral care. SLP also provided thorough and extensive oral care prior to trials. Patient consumed trials of ice chips and demonstrated a suspected delayed swallow initiation with multiple swallows and an intermittent throat clear indicative of penetration.  Suspect patient's cough/throat clear was too weak to expel possible penetrates.  Patient also required Mod A multimodal cues for utilization of an increased vocal intensity at the phrase level. Patient required total A for intellectual awareness of both his cognitive and physical deficits and was perseverative on removing his  C-collar throughout the session and required Mod A multimodal cues for sustained attention to tasks. Patient left at RN station with mitts and quick release belt in place. Continue with current plan of care.    FIM:  Comprehension Comprehension Mode: Auditory Comprehension: 3-Understands basic 50 - 74% of the time/requires cueing 25 - 50%  of the time Expression Expression Mode: Verbal Expression: 3-Expresses basic 50 - 74% of the time/requires cueing 25 - 50% of the time. Needs to repeat parts of sentences. Social Interaction Social Interaction: 3-Interacts appropriately 50 - 74% of the time - May be physically or verbally inappropriate. Problem Solving Problem Solving: 1-Solves basic less than 25% of the time - needs direction nearly all the time or does not effectively solve problems and may need a restraint for safety Memory Memory: 2-Recognizes or recalls 25 - 49% of the time/requires cueing 51 - 75% of the time FIM - Eating Eating Activity: 1: Helper feeds patient (with trials )  Pain Pain Assessment Pain Assessment: No/denies pain   Therapy/Group: Individual Therapy  Hunter Myers 07/14/2014, 3:36 PM

## 2014-07-14 NOTE — Progress Notes (Signed)
Social Work Patient ID: Hunter Myers, male   DOB: 12/17/1945, 68 y.o.   MRN: [id number]   [name], LCSW Social Worker Signed  Patient Care Conference 07/14/2014  6:07 PM    Expand All Collapse All   Inpatient RehabilitationTeam Conference and Plan of Care Update Date: 07/14/2014   Time: 3:20 PM     Patient Name: Hunter Myers       Medical Record Number: [id number]  Date of Birth: 04/06/1946 Sex: Male         Room/Bed: 4W13C/4W13C-01 Payor Info: Payor: MEDICARE / Plan: MEDICARE PART A AND B / Product Type: *No Product type* /    Admitting Diagnosis: major mult trauma  [facility]  WITH RESP FAILURE TRACH   Admit Date/Time:  07/09/2014  3:47 PM Admission Comments: No comment available   Primary Diagnosis:  TBI (traumatic brain injury) Principal Problem: TBI (traumatic brain injury)    Patient Active Problem List     Diagnosis  Date Noted   .  Multiple fractures of cervical spine  07/09/2014   .  Dysphagia, pharyngoesophageal phase     .  Severe malnutrition     .  Acute respiratory failure  06/09/2014   .  TBI (traumatic brain injury)  06/09/2014   .  HCAP (healthcare-associated pneumonia)  06/09/2014   .  C5 vertebral fracture  06/09/2014   .  Acute on chronic respiratory failure  06/09/2014   .  Tracheostomy status  06/09/2014     Expected Discharge Date: Expected Discharge Date: 08/01/14  Team Members Present: Physician leading conference: Dr. Faith RogueZachary Swartz Social Worker Present: Amada JupiterLucy Omeka Holben, LCSW Nurse Present: Carlean PurlMaryann Barbour, RN PT Present: Zerita Boersaroline King, PT OT Present: Ardis Rowanom Lanier, COTA;Jennifer Fredrich RomansSmith, OT;Kayla Perkinson, OT SLP Present: Feliberto Gottronourtney Payne, SLP PPS Coordinator present : Tora DuckMarie Noel, RN, CRRN        Current Status/Progress  Goal  Weekly Team Focus   Medical     hx of tbi/neck trauma, respiratory failure. trach out  sleep/behavior restoration  neck/trach removal. ?collar liberation    Bowel/Bladder     Incontinent of bowel, loose bowel movements  at times, LBM 11/9; incontinent of urine at times with condom catheter in use  Manage bowel and bladder with briefs and max assist   Offer toilet q2-3 hours and PRN   Swallow/Nutrition/ Hydration     NPO with PEG  Min A with least restrictive diet  Trials of ice chips, pharyngeal strengthening exercises    ADL's     max-total assist overall   min assist overall and supervision UB dressing   transfers, sitting and standing balance, cognitive remediation, postural control, safety awarenss, activity tolerance    Mobility     Max-Ax2 persons overall  Supevision w/c level and cognition; Min A standing level   cognitive remediation, postural control, safety awareness, activity tolerance, transfers, balance, ambulation, w/c propulsion    Communication     Patient has dysphonia but is ~90% intelligible  No Goals set at this time  N/A   Safety/Cognition/ Behavioral Observations    Max-total A   Min A  orientation, attention intellectual awareness    Pain     Complains of pain in neck and BLE managed with oxycodone 5 mg scheduled BID and oxycodone 5-10 mg q4hr PRN   </=3  Offer pain medication prior to therapy and PRN   Skin     Dry/flaky skin on bilateral heels with eucerin in use; blanchable redness to sacrum  with allevyn and air mattress in use; healing trach site    No new skin breakdown  Turn patient q2hr in bed and boost q1hr in wheelchair     Rehab Goals Patient on target to meet rehab goals: Yes *See Care Plan and progress notes for long and short-term goals.    Barriers to Discharge:  cognition/safety     Possible Resolutions to Barriers:   supervision, cognitive remediation,routine     Discharge Planning/Teaching Needs:   home with wife and daughtet to provide 24/7 assistance        Team Discussion:    Janina Mayorach out and MD checking if we might be able to d/c collar.  Need to see if TLSO brace and be shaved down.  Supervision/ min goals overall.  Still NPO with peg.  Cognition slowly  improving/.   Revisions to Treatment Plan:    None    Continued Need for Acute Rehabilitation Level of Care: The patient requires daily medical management by a physician with specialized training in physical medicine and rehabilitation for the following conditions: Daily direction of a multidisciplinary physical rehabilitation program to ensure safe treatment while eliciting the highest outcome that is of practical value to the patient.: Yes Daily medical management of patient stability for increased activity during participation in an intensive rehabilitation regime.: Yes Daily analysis of laboratory values and/or radiology reports with any subsequent need for medication adjustment of medical intervention for : Neurological problems;Post surgical problems;Pulmonary problems  Adriahna Shearman 07/14/2014, 6:07 PM

## 2014-07-14 NOTE — Plan of Care (Signed)
Problem: RH BOWEL ELIMINATION Goal: RH STG MANAGE BOWEL WITH ASSISTANCE STG Manage Bowel with max Assistance.  Outcome: Progressing Goal: RH STG MANAGE BOWEL W/MEDICATION W/ASSISTANCE STG Manage Bowel with Medication with max Assistance.  Outcome: Progressing  Problem: RH BLADDER ELIMINATION Goal: RH STG MANAGE BLADDER WITH ASSISTANCE STG Manage Bladder With max Assistance  Outcome: Progressing  Problem: RH SKIN INTEGRITY Goal: RH STG SKIN FREE OF INFECTION/BREAKDOWN Remain free form skin breakdown while on Rehab with max assist  Outcome: Progressing Goal: RH STG MAINTAIN SKIN INTEGRITY WITH ASSISTANCE STG Maintain Skin Integrity With mod Assistance.  Outcome: Progressing Goal: RH STG ABLE TO PERFORM INCISION/WOUND CARE W/ASSISTANCE STG Able To Perform Incision/Wound Care With total Assistance.  Outcome: Progressing  Problem: RH SAFETY Goal: RH STG ADHERE TO SAFETY PRECAUTIONS W/ASSISTANCE/DEVICE STG Adhere to Safety Precautions With max Assistance/Device.  Outcome: Progressing Goal: RH STG DECREASED RISK OF FALL WITH ASSISTANCE STG Decreased Risk of Fall With max Assistance.  Outcome: Progressing  Problem: RH PAIN MANAGEMENT Goal: RH STG PAIN MANAGED AT OR BELOW PT'S PAIN GOAL Pain level less than 3  Outcome: Progressing     

## 2014-07-14 NOTE — Progress Notes (Signed)
Occupational Therapy Session Note  Patient Details  Name: Hunter MartesDwight Myers MRN: [id number] Date of Birth: 07/03/1946  Today's Date: 07/14/2014 OT Individual Time: [id number] OT Individual Time Calculation (min): 50 min    Short Term Goals: Week 1:  OT Short Term Goal 1 (Week 1): Pt will complete toilet transfer with max assist +1 OT Short Term Goal 2 (Week 1): Pt will complete LB dressing with max assist  OT Short Term Goal 3 (Week 1): Pt will consistently be oriented x3 with min cues OT Short Term Goal 4 (Week 1): Pt will complete bathing with mod assist for washing body parts and max assist sit<>stand  Skilled Therapeutic Interventions/Progress Updates:    Pt seen for ADL retraining with focus on attention, bed mobility, functional transfers, standing balance, cognitive remediation, and activity tolerance. Pt received supine in bed with RN providing care (missed 10 min). Completed UB dressing in bed with pt completing with setup assist. Rolled L<>R with min-supervision to don TLSO. Completed supine>sit with min assist and pt required SBA for static sitting balance and min-mod assist for dynamic sitting balance. Completed stand pivot transfer with max assist and increased time bed>w/c. Engaged in LB self-care tasks with pt requiring mod-max assist for sit<>stand and pt assisting with hygiene and clothing management. Pt assisted with threading BLEs into pants via lifting legs with BUEs. Pt required frequent rest breaks d/t fatigue. Pt required max cues for attention to task as pt consistently pulling at cervical collar. Pt asking therapist to take off collar multiple times after therapist explained importance of wearing brace. Pt left sitting in w/c at nurses station.   Therapy Documentation Precautions:  Precautions Precautions: Fall, Cervical, Back Precaution Comments: trach, NPO with PEG Required Braces or Orthoses: Cervical Brace, Spinal Brace Cervical Brace: Hard collar Spinal Brace:  Thoracolumbosacral orthotic, Applied in supine position Restrictions Weight Bearing Restrictions: No General: General OT Amount of Missed Time: 10 Minutes Vital Signs:   Pain: Pain Assessment Pain Assessment: No/denies pain Pain Score: 0-No pain   See FIM for current functional status  Therapy/Group: Individual Therapy  [name], Ashlon Lottman N 07/14/2014, 10:49 AM

## 2014-07-15 ENCOUNTER — Inpatient Hospital Stay (HOSPITAL_COMMUNITY): Payer: Medicare Other

## 2014-07-15 ENCOUNTER — Inpatient Hospital Stay (HOSPITAL_COMMUNITY): Payer: Medicare Other | Admitting: Speech Pathology

## 2014-07-15 DIAGNOSIS — S069X5S Unspecified intracranial injury with loss of consciousness greater than 24 hours with return to pre-existing conscious level, sequela: Secondary | ICD-10-CM

## 2014-07-15 MED ORDER — PRO-STAT SUGAR FREE PO LIQD
30.0000 mL | Freq: Three times a day (TID) | ORAL | Status: DC
  Administered 2014-07-15 – 2014-07-30 (×44): 30 mL
  Filled 2014-07-15 (×52): qty 30

## 2014-07-15 MED ORDER — JEVITY 1.5 CAL/FIBER PO LIQD
325.0000 mL | Freq: Four times a day (QID) | ORAL | Status: DC
  Administered 2014-07-15: 150 mL
  Administered 2014-07-16: 325 mL
  Administered 2014-07-16: 250 mL
  Administered 2014-07-16 – 2014-07-30 (×54): 325 mL
  Filled 2014-07-15 (×70): qty 1000

## 2014-07-15 MED ORDER — FREE WATER: 250.0000 mL | Freq: Every day | Status: DC

## 2014-07-15 MED ORDER — FREE WATER
200.0000 mL | Freq: Every day | Status: DC
  Administered 2014-07-15 – 2014-07-17 (×14): 200 mL

## 2014-07-15 NOTE — Plan of Care (Signed)
Problem: RH BLADDER ELIMINATION Goal: RH STG MANAGE BLADDER WITH ASSISTANCE STG Manage Bladder With max Assistance  Outcome: Progressing  Problem: RH SKIN INTEGRITY Goal: RH STG SKIN FREE OF INFECTION/BREAKDOWN Remain free form skin breakdown while on Rehab with max assist  Outcome: Progressing  Problem: RH SAFETY Goal: RH STG ADHERE TO SAFETY PRECAUTIONS W/ASSISTANCE/DEVICE STG Adhere to Safety Precautions With max Assistance/Device.  Outcome: Progressing  Problem: RH PAIN MANAGEMENT Goal: RH STG PAIN MANAGED AT OR BELOW PT'S PAIN GOAL Pain level less than 3  Outcome: Progressing

## 2014-07-15 NOTE — Progress Notes (Signed)
Occupational Therapy Session Note  Patient Details  Name: Hunter Myers MRN: [id number] Date of Birth: 03/15/1946  Today's Date: 07/15/2014 OT Individual Time: 4098-11910830-0930 OT Individual Time Calculation (min): 60 min    Short Term Goals: Week 1:  OT Short Term Goal 1 (Week 1): Pt will complete toilet transfer with max assist +1 OT Short Term Goal 2 (Week 1): Pt will complete LB dressing with max assist  OT Short Term Goal 3 (Week 1): Pt will consistently be oriented x3 with min cues OT Short Term Goal 4 (Week 1): Pt will complete bathing with mod assist for washing body parts and max assist sit<>stand  Skilled Therapeutic Interventions/Progress Updates:    Pt seen for ADL retraining with focus on bed mobility, functional transfers, standing balance, activity tolerance, and cognitive remediation. Pt received supine in bed requesting to void. Utilized urinal with setup assist and pt having continent episode. Completed UB bathing and dressing supine in bed then required total assist for donning TLSO and supervision for rolling L<>R. Pt reporting need for BM and agreeable to transfer to toilet. Completed squat pivot transfer bed>w/c with mod assist and mod cues. Pt impulsive at times and attempts to grab onto therapist. Completed stand pivot transfer w/c<>toilet with mod assist and use of grab bars. Pt required SBA sitting balance and total assist for toilet task with mod-max assist for hygiene. Completed LB dressing in standing at sink with pt requiring max assist for standing balance and max cues as pt demonstrating posterior lean. Pt perseverating on wanting water throughout session, with therapist providing education on diet restrictions at this time. Pt fatigued quickly during session, requiring multiple rest breaks. Pt left sitting in w/c at RN station.   Therapy Documentation Precautions:  Precautions Precautions: Fall, Cervical, Back Precaution Comments: trach, NPO with PEG Required Braces  or Orthoses: Cervical Brace, Spinal Brace Cervical Brace: Hard collar Spinal Brace: Thoracolumbosacral orthotic, Applied in supine position Restrictions Weight Bearing Restrictions: No General:   Vital Signs: Therapy Vitals Pulse Rate: 80 BP: 118/83 mmHg Patient Position (if appropriate): Lying Pain:   No report of pain during therapy session.   See FIM for current functional status  Therapy/Group: Individual Therapy  [name], Ronell Boldin N 07/15/2014, 12:04 PM

## 2014-07-15 NOTE — Progress Notes (Signed)
Physical Therapy Session Note  Patient Details  Name: Hunter MartesDwight Heppler MRN: [id number] Date of Birth: 09/22/1945  Today's Date: 07/15/2014 PT Individual Time: 1100-1200 PT Individual Time Calculation (min): 60 min   Short Term Goals: Week 1:  PT Short Term Goal 1 (Week 1): Patient will perform bed mobility with use of hospital bed functions and modA. PT Short Term Goal 2 (Week 1): Patient will perform functional transfers with maxA x1. PT Short Term Goal 3 (Week 1): Patient will perform gait training 3350' with +2 assist. PT Short Term Goal 4 (Week 1): Patient will negotiate 2 steps with B handrails and +2 assist. PT Short Term Goal 5 (Week 1): Patient will demonstrate carry over within session with mobility techniques with mod cues.  Skilled Therapeutic Interventions/Progress Updates:    Pt received supine in bed, agreeable to participate in therapy. Pt rolled L/R w/ MinA in order to don TLSO. Throughout session pt complained of discomfort w/ TLSO over tube feed site, despite multiple adjustments by therapist. Pt w/ multiple scoot transfers w/c<>bed/mat w/ MinA after max cueing from therapist for set up, sequencing, and hand placement. Seated at edge of mat for seated balance/trunk control pt reached L/R on table to retrieve rings and tossed towards target 6x3. Pt propelled w/c 100' w/ BUE w/ MinA, intermittent HOH assist on L. Pt requested to return to bed at end of session due to discomfort w/ TLSO. Pt moved sit<>supine w/ close supervision, repositioned up in bed w/ MinA and use of Trendelenburg position. Pt left supine in bed w/ RN present, mittens on and all needs within reach.   Therapy Documentation Precautions:  Precautions Precautions: Fall, Cervical, Back Precaution Comments: trach, NPO with PEG Required Braces or Orthoses: Cervical Brace, Spinal Brace Cervical Brace: Hard collar Spinal Brace: Thoracolumbosacral orthotic, Applied in supine position Restrictions Weight Bearing  Restrictions: No General:   Vital Signs: Therapy Vitals Temp: 98.6 F (37 C) Temp Source: Oral Pulse Rate: 72 Resp: 18 BP: (!) 99/52 mmHg Patient Position (if appropriate): Lying Oxygen Therapy SpO2: 96 % O2 Device: Not Delivered Pain:   Not rated, felt discomfort over TF site w/ TLSO donned. Felt better w/ TLSO off.  See FIM for current functional status  Therapy/Group: Individual Therapy  Hosie SpangleGodfrey, Zacariah Belue  Hosie SpangleJess Haylen Bellotti, PT, DPT 07/15/2014, 7:44 AM

## 2014-07-15 NOTE — Progress Notes (Signed)
Speech Language Pathology Daily Session Note  Patient Details  Name: Hunter MartesDwight Myers MRN: [id number] Date of Birth: 02/04/1946  Today's Date: 07/15/2014 SLP Individual Time: [id number] SLP Individual Time Calculation (min): 45 min  Short Term Goals: Week 1: SLP Short Term Goal 1 (Week 1): Patient will demonstrate sustained attention to a functional and familiar task for 5 minutes with Max A multimodal cues cues.  SLP Short Term Goal 2 (Week 1): Patient will orient to time, place and situation with Min A multimodal cues.  SLP Short Term Goal 3 (Week 1): Patient will identify 2 physical and 2 cognitive deficits with Max A multimodal cues.  SLP Short Term Goal 4 (Week 1): Patient will peform pharyngeal strengthening exercises with Max A multimodal cues.  SLP Short Term Goal 5 (Week 1): Patient will consume trials of ice chips with minimal overt s/s of aspiration with Max A mutlimodal cues.  SLP Short Term Goal 6 (Week 1): Patient will utilize an increased vocal intensity at the phrase level with Max A multimodal cues.   Skilled Therapeutic Interventions:  Pt was seen for skilled ST targeting goals for dysphagia and cognition.  Upon arrival, nurse tech present with reports that pt had removed c-collar.  Pt was obviously restless and agitated and was unable to be redirected from pulling at his c collar; therefore, SLP facilitated relaxation and de-escalation with ~3 minutes in a quiet environment with min cuing for use of pursed lip breathing.  After relaxation techniques, pt was agreeable to participate in trials of ice chips.  SLP provided thorough oral care prior to PO trials then removed right hand mitt to facilitate pt self feeding during PO trials.  Pt exhibited timely oral phase with efficient mastication of ice chips; pt also presented with suspected delayed swallow initiation but no other overt s/s of aspiration.  SLP replaced bilateral hand mitts at completion of session.  Continue per current  plan of care.    FIM:  Comprehension Comprehension Mode: Auditory Comprehension: 3-Understands basic 50 - 74% of the time/requires cueing 25 - 50%  of the time Expression Expression Mode: Verbal Expression: 3-Expresses basic 50 - 74% of the time/requires cueing 25 - 50% of the time. Needs to repeat parts of sentences. Social Interaction Social Interaction: 3-Interacts appropriately 50 - 74% of the time - May be physically or verbally inappropriate. Problem Solving Problem Solving: 2-Solves basic 25 - 49% of the time - needs direction more than half the time to initiate, plan or complete simple activities Memory Memory: 2-Recognizes or recalls 25 - 49% of the time/requires cueing 51 - 75% of the time FIM - Eating Eating Activity: 4: Helper occasionally scoops food on utensil (with trials )  Pain Pain Assessment Pain Assessment: No/denies pain  Therapy/Group: Individual Therapy   Jackalyn LombardNicole Kacy Conely, M.A. CCC-SLP  Thereasa Iannello, Melanee SpryNicole L 07/15/2014, 4:14 PM

## 2014-07-15 NOTE — Progress Notes (Signed)
Called in order for adOrthopedic Tech Progress Note Patient Details:  Hunter MartesDwight Myers 07/09/1946 [id number]  Patient ID: Hunter Myers, male   DOB: 10/09/1945, 68 y.o.   MRN: [id number]  Called in order for advanced to do modification for tlso brace; spoke with Christin Bachjulia Nahiara Kretzschmar 07/15/2014, 9:25 AM

## 2014-07-15 NOTE — Progress Notes (Signed)
[facility] PHYSICAL MEDICINE & REHABILITATION     PROGRESS NOTE    Subjective/Complaints: No new issues. Asleep this am. Denies pain. Remains restless  Objective: Vital Signs: Blood pressure 120/85, pulse 81, temperature 98.6 F (37 C), temperature source Oral, resp. rate 18, height 5\' 10"  (1.778 m), weight 52 kg (114 lb 10.2 oz), SpO2 96 %. No results found. No results for input(s): WBC, HGB, HCT, PLT in the last 72 hours. No results for input(s): NA, K, CL, GLUCOSE, BUN, CREATININE, CALCIUM in the last 72 hours.  Invalid input(s): CO CBG (last 3)  No results for input(s): GLUCAP in the last 72 hours.  Wt Readings from Last 3 Encounters:  07/15/14 52 kg (114 lb 10.2 oz)  07/09/14 58.514 kg (129 lb)    Physical Exam:   Constitutional: He appears well-developed. He appears cachectic. He has a sickly appearance. Cervical collar loose. HENT: Voice is hoarse but air leakage through trach, Head: Normocephalic and atraumatic.  Eyes: Conjunctivae are normal. Pupils are equal, round, and reactive to light.  Neck: air escaping from stoma--area clean. Cardiovascular: Regular rhythm.  Respiratory: Effort normal and breath sounds normal. No respiratory distress. He has no wheezes.  GI: Soft. Bowel sounds are normal. He exhibits no distension.  PEG site clean and dry. Mild tenderness around the PEG site Musculoskeletal: He exhibits no edema.  Unable to raise left shoulder and some discomfort with ROM initially. Question right foot drop.  Neurological: He is alert.  Oriented to self. Lacks awareness and insight into current deficits. Restless.  + dysphonia.    3+ prox to 4/5 distally in UE's. LE: Left HF 3/5. RHF 2/5. Distally 3 to 4/5 Skin: Skin is warm and dry. Mild erythema around the PICC site right medial arm Bilateral heels with dry flaky patches.   Assessment/Plan: 1. Functional deficits secondary to traumatic brain injury with cervical and thoracic spine fractures   which require 3+ hours per day of interdisciplinary therapy in a comprehensive inpatient rehab setting. Physiatrist is providing close team supervision and 24 hour management of active medical problems listed below. Physiatrist and rehab team continue to assess barriers to discharge/monitor patient progress toward functional and medical goals. FIM: FIM - Bathing Bathing Steps Patient Completed: Chest, Right Arm, Left Arm, Abdomen, Front perineal area, Right upper leg, Left upper leg Bathing: 2: Max-Patient completes 3-4 3611f 10 parts or 25-49% (max assist sit<>stand and standing balance)  FIM - Upper Body Dressing/Undressing Upper body dressing/undressing steps patient completed: Thread/unthread right sleeve of pullover shirt/dresss, Thread/unthread left sleeve of pullover shirt/dress Upper body dressing/undressing: 3: Mod-Patient completed 50-74% of tasks FIM - Lower Body Dressing/Undressing Lower body dressing/undressing: 1: Total-Patient completed less than 25% of tasks  FIM - Toileting Toileting steps completed by patient: Adjust clothing prior to toileting, Adjust clothing after toileting Toileting Assistive Devices: Grab bar or rail for support Toileting: 1: Two helpers  FIM - Diplomatic Services operational officerToilet Transfers Toilet Transfers Assistive Devices: Therapist, musicGrab bars Toilet Transfers: 2-To toilet/BSC: Max A (lift and lower assist), 1-Two helpers  FIM - BankerBed/Chair Transfer Bed/Chair Transfer Assistive Devices: Arm rests Bed/Chair Transfer: 2: Chair or W/C > Bed: Max A (lift and lower assist), 3: Bed > Chair or W/C: Mod A (lift or lower assist), 3: Sit > Supine: Mod A (lifting assist/Pt. 50-74%/lift 2 legs)  FIM - Locomotion: Wheelchair Distance: 150 Locomotion: Wheelchair: 1: Total Assistance/staff pushes wheelchair (Pt<25%) FIM - Locomotion: Ambulation Locomotion: Ambulation Assistive Devices: Other (comment) (3 Musketeer) Ambulation/Gait Assistance: 1: +2 Total assist (+  3 for w/c follow) Locomotion:  Ambulation: 0: Activity did not occur  Comprehension Comprehension Mode: Auditory Comprehension: 3-Understands basic 50 - 74% of the time/requires cueing 25 - 50%  of the time  Expression Expression Mode: Verbal Expression: 3-Expresses basic 50 - 74% of the time/requires cueing 25 - 50% of the time. Needs to repeat parts of sentences.  Social Interaction Social Interaction: 3-Interacts appropriately 50 - 74% of the time - May be physically or verbally inappropriate.  Problem Solving Problem Solving: 1-Solves basic less than 25% of the time - needs direction nearly all the time or does not effectively solve problems and may need a restraint for safety  Memory Memory: 3-Recognizes or recalls 50 - 74% of the time/requires cueing 25 - 49% of the time Medical Problem List and Plan: 1. Functional deficits secondary to TBI, cervical and thoracic spine fractures.   [facility]  -have asked Orthotist to trim down TLSO for better fit 2. DVT Prophylaxis/Anticoagulation: Pharmaceutical: Lovenox 3. Pain Management: Will continue oxycodone bid with prn doses as needed. Lidocain patch for neck pain. 4. H/o anxiety disorder/Mood: Will continue xanax prn for now. Patient has poor insight and lacks awareness of deficits. LCSW to follow along for support and evaluation when indicated.  5. Neuropsych: This patient is not capable of making decisions on his own behalf. 6. Skin/Wound Care: Pressure relief measures. Continue air mattress overlay. Added Eucerin cream for dry skin bilateral feet.  7. Fluids/Electrolytes/Nutrition: Monitor I/O. Electrolytes/bun/cr all look reasonable  -continue TF---change to bolus feeds  -rd consult 8. ABLA:  hgb 11.5 9. Pulmonary: trach out. Stoma closing. Still leaks air  LOS (Days) 6 A FACE TO FACE EVALUATION WAS PERFORMED  Taeko Schaffer T 07/15/2014 8:13 AM

## 2014-07-15 NOTE — Progress Notes (Signed)
Speech Language Pathology Daily Session Note  Patient Details  Name: Hunter MartesDwight Myers MRN: [id number] Date of Birth: 07/22/1946  Today's Date: 07/15/2014 SLP Individual Time: 1015-1100 SLP Individual Time Calculation (min): 45 min  Short Term Goals: Week 1: SLP Short Term Goal 1 (Week 1): Patient will demonstrate sustained attention to a functional and familiar task for 5 minutes with Max A multimodal cues cues.  SLP Short Term Goal 2 (Week 1): Patient will orient to time, place and situation with Min A multimodal cues.  SLP Short Term Goal 3 (Week 1): Patient will identify 2 physical and 2 cognitive deficits with Max A multimodal cues.  SLP Short Term Goal 4 (Week 1): Patient will peform pharyngeal strengthening exercises with Max A multimodal cues.  SLP Short Term Goal 5 (Week 1): Patient will consume trials of ice chips with minimal overt s/s of aspiration with Max A mutlimodal cues.  SLP Short Term Goal 6 (Week 1): Patient will utilize an increased vocal intensity at the phrase level with Max A multimodal cues.   Skilled Therapeutic Interventions: Skilled treatment session focused on dysphagia and cognitive goals. Upon arrival, patient was awake while supine in bed with visitors present. SLP facilitated session by providing supervision verbal cues for functional problem solving with use of the suction toothbrush during oral care. SLP also provided thorough and extensive oral care prior to trials. Patient consumed trials of ice chips and demonstrated a suspected delayed swallow initiation with multiple swallows and an intermittent throat clear indicative of penetration.  Suspect patient's cough/throat clear was too weak to expel possible penetrates.  Patient required total A for intellectual awareness of both his cognitive and physical deficits and was perseverative on removing his C-collar throughout the session. Education was also provided to the patient's brother and sister-in-law in regards to  his current swallowing function and goals of skilled SLP intervention. Both verbalized understanding. Patient left supine in bed with all needs within reach. Continue with current plan of care.   FIM:  Comprehension Comprehension Mode: Auditory Comprehension: 3-Understands basic 50 - 74% of the time/requires cueing 25 - 50%  of the time Expression Expression Mode: Verbal Expression: 3-Expresses basic 50 - 74% of the time/requires cueing 25 - 50% of the time. Needs to repeat parts of sentences. Social Interaction Social Interaction: 3-Interacts appropriately 50 - 74% of the time - May be physically or verbally inappropriate. Problem Solving Problem Solving: 2-Solves basic 25 - 49% of the time - needs direction more than half the time to initiate, plan or complete simple activities Memory Memory: 2-Recognizes or recalls 25 - 49% of the time/requires cueing 51 - 75% of the time FIM - Eating Eating Activity: 4: Helper occasionally scoops food on utensil (with trials )  Pain Pain Assessment Pain Assessment: No/denies pain   Therapy/Group: Individual Therapy  Nancy Arvin 07/15/2014, 4:24 PM
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
## 2014-07-15 NOTE — Progress Notes (Signed)
Social Work Patient ID: Hunter MartesDwight Messner, male   DOB: 10/18/1945, 68 y.o.   MRN: [id number]   Have reviewed team conference with patient's wife via phone.  She is aware and agreeable with targeted d/c date of 11/28 with min assist/ supervision goals with w/c and standing goals.  She asked if there would be repeat scans of pt's neck and back prior to d/c as she is concerned that he might try and take off the brace when he is at home.  Explained that this is MD decision.  She denies any other concerns at this point.  Will continue to follow for support and d/c planning.  Shelia Magallon, LCSW

## 2014-07-15 NOTE — Progress Notes (Signed)
Physical Therapy Session Note  Patient Details  Name: Hunter MartesDwight Myers MRN: [id number] Date of Birth: 03/23/1946  Today's Date: 07/15/2014 PT Individual Time: 1435-1540 PT Individual Time Calculation (min): 65 min   Short Term Goals: Week 1:  PT Short Term Goal 1 (Week 1): Patient will perform bed mobility with use of hospital bed functions and modA. PT Short Term Goal 2 (Week 1): Patient will perform functional transfers with maxA x1. PT Short Term Goal 3 (Week 1): Patient will perform gait training 5250' with +2 assist. PT Short Term Goal 4 (Week 1): Patient will negotiate 2 steps with B handrails and +2 assist. PT Short Term Goal 5 (Week 1): Patient will demonstrate carry over within session with mobility techniques with mod cues.  Skilled Therapeutic Interventions/Progress Updates:   Pt supine in bed, wearing cervical collar.   Scarlette SliceChris Roura here to deliver modified TLSO.  Pt log rolled with min cues for donning in supine.  Supine> sit with railing, min assist.  Squat pivot to L bed> wc with mod assist.  Seated therapeutic exercise performed with LEs to increase strength for functional mobility: resisted bil hip add/abd, long arc quad knee extension L, active assistive R, L ankle DF/PF  Sit>< stand pushing up with bil UEs, min/mod assist.  neuromuscular re-education via forced use, multimodal cues for RLE stance stability during wt shifting L<>R in standing, L step/taps, kicking a ball with R foot forward and sideways in sitting, and stairs.  Up/down 3 5" high steps, 2 rails, max assist, step to forward and step to backward to descend.  Pt requested getting back in bed. W/c> bed to R squat pivot with max assist due to uncontrolled descent.  Sit> supine with supervision.  Bed alarm set, mitts placed on bil hands.  Pt log rolled with cues for doffing TLSO.  Pt very uncomfortable in cervical collar, but stated TLSO was better since adjustment.     Therapy Documentation Precautions:   Precautions Precautions: Fall, Cervical, Back Precaution Comments: trach, NPO with PEG Required Braces or Orthoses: Cervical Brace, Spinal Brace Cervical Brace: Hard collar Spinal Brace: Thoracolumbosacral orthotic, Applied in supine position Restrictions Weight Bearing Restrictions: No   Vital Signs: Therapy Vitals Temp: 98.8 F (37.1 C) Temp Source: Oral Pulse Rate: 92 Resp: 17 BP: (!) 146/99 mmHg Patient Position (if appropriate): Sitting Pain: Pain Assessment Pain Assessment: No/denies pain Pain Score: 5  Pain Type: Acute pain Pain Location: Neck Pain Intervention(s): Medication (See eMAR)   Locomotion : Wheelchair Mobility Distance: 100       See FIM for current functional status  Therapy/Group: Individual Therapy  Layal Javid 07/15/2014, 4:06 PM

## 2014-07-15 NOTE — Plan of Care (Signed)
Problem: RH BOWEL ELIMINATION Goal: RH STG MANAGE BOWEL WITH ASSISTANCE STG Manage Bowel with max Assistance.  Outcome: Progressing Goal: RH STG MANAGE BOWEL W/MEDICATION W/ASSISTANCE STG Manage Bowel with Medication with max Assistance.  Outcome: Progressing  Problem: RH BLADDER ELIMINATION Goal: RH STG MANAGE BLADDER WITH ASSISTANCE STG Manage Bladder With max Assistance  Outcome: Progressing  Problem: RH SKIN INTEGRITY Goal: RH STG SKIN FREE OF INFECTION/BREAKDOWN Remain free form skin breakdown while on Rehab with max assist  Outcome: Progressing Goal: RH STG MAINTAIN SKIN INTEGRITY WITH ASSISTANCE STG Maintain Skin Integrity With mod Assistance.  Outcome: Progressing Goal: RH STG ABLE TO PERFORM INCISION/WOUND CARE W/ASSISTANCE STG Able To Perform Incision/Wound Care With total Assistance.  Outcome: Progressing  Problem: RH SAFETY Goal: RH STG ADHERE TO SAFETY PRECAUTIONS W/ASSISTANCE/DEVICE STG Adhere to Safety Precautions With max Assistance/Device.  Outcome: Progressing Goal: RH STG DECREASED RISK OF FALL WITH ASSISTANCE STG Decreased Risk of Fall With max Assistance.  Outcome: Progressing  Problem: RH PAIN MANAGEMENT Goal: RH STG PAIN MANAGED AT OR BELOW PT'S PAIN GOAL Pain level less than 3  Outcome: Progressing     

## 2014-07-16 ENCOUNTER — Inpatient Hospital Stay (HOSPITAL_COMMUNITY): Payer: Medicare Other | Admitting: *Deleted

## 2014-07-16 ENCOUNTER — Inpatient Hospital Stay (HOSPITAL_COMMUNITY): Payer: Medicare Other

## 2014-07-16 ENCOUNTER — Inpatient Hospital Stay (HOSPITAL_COMMUNITY): Payer: Medicare Other | Admitting: Speech Pathology

## 2014-07-16 LAB — CBC
HCT: 34.6 % — ABNORMAL LOW (ref 39.0–52.0)
Hemoglobin: 11.1 g/dL — ABNORMAL LOW (ref 13.0–17.0)
MCH: 28.2 pg (ref 26.0–34.0)
MCHC: 32.1 g/dL (ref 30.0–36.0)
MCV: 87.8 fL (ref 78.0–100.0)
PLATELETS: 273 10*3/uL (ref 150–400)
RBC: 3.94 MIL/uL — ABNORMAL LOW (ref 4.22–5.81)
RDW: 15 % (ref 11.5–15.5)
WBC: 5.8 10*3/uL (ref 4.0–10.5)

## 2014-07-16 LAB — BASIC METABOLIC PANEL
ANION GAP: 11 (ref 5–15)
BUN: 20 mg/dL (ref 6–23)
CHLORIDE: 99 meq/L (ref 96–112)
CO2: 28 mEq/L (ref 19–32)
Calcium: 9.2 mg/dL (ref 8.4–10.5)
Creatinine, Ser: 0.58 mg/dL (ref 0.50–1.35)
Glucose, Bld: 77 mg/dL (ref 70–99)
POTASSIUM: 4 meq/L (ref 3.7–5.3)
SODIUM: 138 meq/L (ref 137–147)

## 2014-07-16 NOTE — Progress Notes (Signed)
Speech Language Pathology Daily Session Note  Patient Details  Name: Hunter Myers MRN: [id number] Date of Birth: 04/14/1946  Today's Date: 07/16/2014 SLP Individual Time: 1000-1100 SLP Individual Time Calculation (min): 60 min  Short Term Goals: Week 1: SLP Short Term Goal 1 (Week 1): Patient will demonstrate sustained attention to a functional and familiar task for 5 minutes with Max A multimodal cues cues.  SLP Short Term Goal 2 (Week 1): Patient will orient to time, place and situation with Min A multimodal cues.  SLP Short Term Goal 3 (Week 1): Patient will identify 2 physical and 2 cognitive deficits with Max A multimodal cues.  SLP Short Term Goal 4 (Week 1): Patient will peform pharyngeal strengthening exercises with Max A multimodal cues.  SLP Short Term Goal 5 (Week 1): Patient will consume trials of ice chips with minimal overt s/s of aspiration with Max A mutlimodal cues.  SLP Short Term Goal 6 (Week 1): Patient will utilize an increased vocal intensity at the phrase level with Max A multimodal cues.   Skilled Therapeutic Interventions: Skilled treatment session focused on dysphagia and cognitive goals. Upon arrival, patient was awake while supine in bed with one mitt removed. SLP facilitated session by providing supervision verbal cues for functional problem solving with use of the suction toothbrush during oral care. Patient consumed trials of ice chips and demonstrated a suspected delayed swallow initiation with intermittent use of multiple swallows and an intermittent throat clear/cough which was successful in expectorating minimal, clear secretions.  Patient was oriented to place, time and situation with Min A question cues and demonstrated basic intellectual awareness of physical and cognitive deficits with Min A question cues. Patient was perseverative on removing his C-collar throughout the session but demonstrated increased ability to self-monitor and correct when he was  "pulling" at his C-collar with Mod A question and tactile cues. Patient left supine in bed with all needs within reach. Continue with current plan of care.   FIM:  Comprehension Comprehension Mode: Auditory Comprehension: 3-Understands basic 50 - 74% of the time/requires cueing 25 - 50%  of the time Expression Expression Mode: Verbal Expression: 3-Expresses basic 50 - 74% of the time/requires cueing 25 - 50% of the time. Needs to repeat parts of sentences. Social Interaction Social Interaction: 3-Interacts appropriately 50 - 74% of the time - May be physically or verbally inappropriate. Problem Solving Problem Solving: 2-Solves basic 25 - 49% of the time - needs direction more than half the time to initiate, plan or complete simple activities Memory Memory: 2-Recognizes or recalls 25 - 49% of the time/requires cueing 51 - 75% of the time  Pain Pain Assessment Pain Assessment: No/denies pain  Therapy/Group: Individual Therapy  Ailed Defibaugh 07/16/2014, 3:57 PM
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
## 2014-07-16 NOTE — Progress Notes (Signed)
Received call back from Dr. Rosina LowensteinBirkedal's nurse. Dr. Christain SacramentoBirkedal would like [place] and [place] for at least 3 months but can raise HOB < 30 without TLSO. She'll call back regarding follow up X rays that need to be done. Hunter Myers set for 3 month check at Dr. Rosina LowensteinBirkedal's office on 08/24/14 at 3 pm.
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
## 2014-07-16 NOTE — Progress Notes (Signed)
Occupational Therapy Session Note  Patient Details  Name: Hunter MartesDwight Myers MRN: [id number] Date of Birth: 09/26/1945  Today's Date: 07/16/2014 OT Individual Time: 1100-1200 and 1300-1400 and 1435-1450 OT Individual Time Calculation (min): 60 min and 60 min and 15 min     Short Term Goals: Week 1:  OT Short Term Goal 1 (Week 1): Pt will complete toilet transfer with max assist +1 OT Short Term Goal 2 (Week 1): Pt will complete LB dressing with max assist  OT Short Term Goal 3 (Week 1): Pt will consistently be oriented x3 with min cues OT Short Term Goal 4 (Week 1): Pt will complete bathing with mod assist for washing body parts and max assist sit<>stand  Skilled Therapeutic Interventions/Progress Updates:    Session 1: Pt seen for ADL retraining with focus on attention, recall, sitting balance, postural control, and bed mobility. Pt received supine in bed following SLP session. Pt oriented x3 with min questioning cues for orientation to place. Pt with increased fatigue this AM requiring max cues to stay awake and multiple rest breaks. Completed washing of face, chest, abs, and peri hygiene while supine in bed. Pt completed log roll left and right with min assist to don abdominal binder and TLSO. Pt sat EOB with SBA for static sitting balance and min-max assist for dynamic sitting balance while washing arms and BLEs. Pt required max assist for crossover technique and able to wash L foot before fatiguing. Pt assisted with donning L sock ~50% then required total assist to don right sock. Completed sit<>stand from bed 2x with mod-max assist and pt demonstrating posterior lean. Pt completed scooting in bed with supervision and cues for sequencing. Pt transferred to supine with mod assist and left with all needs in reach. Pt required mod cues for to not pull on cervical brace throughout session. Pt required max cues for attention as he perseverated on not wanting TLSO or cervical brace.   Session 2: Pt seen  for co-treat with RT (1300-1330) with focus on functional transfers, sitting balance, postural control, BUE strength, and activity tolerance. Pt received supine in bed with wife and daughter present. Pt completed log rolling left and right with min assist to don TLSO. Completed squat pivot transfer bed>w/c with mod assist. Engaged in ball toss activity sitting edge of mat with min-SBA for sitting balance. Cued pt keep count of reps up to 10, however pt unable to attend past 8 reps. Engaged in ring toss activity in sitting with min-mod assist for sitting balance. Pt with poor postural control when reaching chest level for rings. Pt added up points during activity with 100% accuracy. Engaged in clothes pins task sitting unsupported with emphasis on grip strength and activity tolerance. Pt required frequent rest breaks in supine during session d/t fatigue. Pt required max cues for not pulling on cervical brace throughout session, as pt demonstrating poor frustration tolerance with discomfort. Pt left sitting at nurses station with all needs in reach.   Session 3: Pt received sitting in w/c at nurses station attempting to remove mitts. Pt immediately requesting to return to bed d/t fatigue. Pt propelled self in w/c using LLE and BUE back to room with no cues for way finding. Pt setup transfer at bed with assist to remove arm rest and lock brakes as pt attempting to lock them, however unable to d/t weakness. Pt completed squat pivot transfer w/c>bed with min assist then scooting in bed at supervision level. Pt assisted to supine. Required total assist  for positioning of urinal and changing brief via log rolling left and right with min cues. Pt falling asleep immediately. Pt left supine in bed with mitts donned and all needs in reach. Pt missing 15 min skilled OT d/t fatigue.   Therapy Documentation Precautions:  Precautions Precautions: Fall, Cervical, Back Precaution Comments: NPO with PEG Required Braces or  Orthoses: Cervical Brace, Spinal Brace Cervical Brace: Hard collar Spinal Brace: Thoracolumbosacral orthotic, Applied in supine position Restrictions Weight Bearing Restrictions: No General:   Vital Signs: Therapy Vitals Pulse Rate: (!) 108 BP: 125/90 mmHg Patient Position (if appropriate): Sitting Pain: No report of pain during therapy sessions.  See FIM for current functional status  Therapy/Group: Individual Therapy  [name], Thamas Appleyard N 07/16/2014, 12:06 PM

## 2014-07-17 ENCOUNTER — Inpatient Hospital Stay (HOSPITAL_COMMUNITY): Payer: Medicare Other

## 2014-07-17 ENCOUNTER — Inpatient Hospital Stay (HOSPITAL_COMMUNITY): Payer: Medicare Other | Admitting: *Deleted

## 2014-07-17 ENCOUNTER — Inpatient Hospital Stay (HOSPITAL_COMMUNITY): Payer: Medicare Other | Admitting: Speech Pathology

## 2014-07-17 MED ORDER — FREE WATER
300.0000 mL | Freq: Four times a day (QID) | Status: DC
  Administered 2014-07-17 – 2014-07-28 (×44): 300 mL

## 2014-07-17 NOTE — Progress Notes (Signed)
[place] PHYSICAL MEDICINE & REHABILITATION     PROGRESS NOTE    Subjective/Complaints: A little restless. No new complaints  Objective: Vital Signs: Blood pressure 133/79, pulse 78, temperature 97.7 F (36.5 C), temperature source Oral, resp. rate 16, height 5\' 10"  (1.778 m), weight 52.617 kg (116 lb), SpO2 99 %. No results found.  Recent Labs  07/16/14 0418  WBC 5.8  HGB 11.1*  HCT 34.6*  PLT 273    Recent Labs  07/16/14 0418  NA 138  K 4.0  CL 99  GLUCOSE 77  BUN 20  CREATININE 0.58  CALCIUM 9.2   CBG (last 3)  No results for input(s): GLUCAP in the last 72 hours.  Wt Readings from Last 3 Encounters:  07/17/14 52.617 kg (116 lb)  07/09/14 58.514 kg (129 lb)    Physical Exam:   Constitutional: He appears well-developed. He appears cachectic. He has a sickly appearance. Cervical collar loose. HENT: Voice is hoarse but air leakage through trach, Head: Normocephalic and atraumatic.  Eyes: Conjunctivae are normal. Pupils are equal, round, and reactive to light.  Neck: air escaping from stoma--area clean. Cardiovascular: Regular rhythm.  Respiratory: Effort normal and breath sounds normal. No respiratory distress. He has no wheezes.  GI: Soft. Bowel sounds are normal. He exhibits no distension.  PEG site clean and dry. Mild tenderness around the PEG site Musculoskeletal: He exhibits no edema.  Unable to raise left shoulder. ? right foot drop.  Neurological: He is alert.  Oriented to self. Lacks awareness and insight into current deficits. Less Restless.  + dysphonia.    3+ prox to 4/5 distally in UE's. LE: Left HF 3/5. RHF 2/5. Distally 3 to 4/5 Skin: Skin is warm and dry. Mild erythema around the PICC site right medial arm Bilateral heels with dry flaky patches.   Assessment/Plan: 1. 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 Physiatrist is providing close team supervision and 24 hour management of active medical problems listed below. Physiatrist and rehab team continue to assess barriers to discharge/monitor patient progress toward functional and medical goals. FIM: FIM - Bathing Bathing Steps Patient Completed: Chest, Right Arm, Left Arm, Abdomen, Front perineal area, Right upper leg, Left upper leg Bathing: 2: Max-Patient completes 3-4 616f 10 parts or 25-49% (max assist sit<>stand)  FIM - Upper Body Dressing/Undressing Upper body dressing/undressing steps patient completed: Thread/unthread right sleeve of pullover shirt/dresss, Thread/unthread left sleeve of pullover shirt/dress Upper body dressing/undressing: 0: Wears gown/pajamas-no public clothing FIM - Lower Body Dressing/Undressing Lower body dressing/undressing: 1: Total-Patient completed less than 25% of tasks  FIM - Toileting Toileting steps completed by patient: Adjust clothing prior to toileting, Adjust clothing after toileting Toileting Assistive Devices: Grab bar or rail for support Toileting: 1: Total-Patient completed zero steps, helper did all 3  FIM - Diplomatic Services operational officerToilet Transfers Toilet Transfers Assistive Devices: Therapist, musicGrab bars Toilet Transfers: 3-From toilet/BSC: Mod A (lift or lower assist), 3-To toilet/BSC: Mod A (lift or lower assist)  FIM - BankerBed/Chair Transfer Bed/Chair Transfer Assistive Devices: Arm rests, Bed rails Bed/Chair Transfer: 4: Supine > Sit: Min A (steadying Pt. > 75%/lift 1 leg), 3: Sit > Supine: Mod A (lifting assist/Pt. 50-74%/lift 2 legs)  FIM - Locomotion: Wheelchair Distance: 150 Locomotion: Wheelchair: 4: Travels 150 ft or more: maneuvers on rugs and over door sillls with minimal assistance (Pt.>75%) FIM - Locomotion: Ambulation Locomotion: Ambulation  Assistive Devices: Other (comment) (3 musketeer style) Ambulation/Gait Assistance: 1: +2 Total assist Locomotion: Ambulation: 1: Two  helpers  Comprehension Comprehension Mode: Auditory Comprehension: 1-Understands basic less than 25% of the time/requires cueing 75% of the time  Expression Expression Mode: Verbal Expression: 1-Expresses basis less than 25% of the time/requires cueing greater than 75% of the time.  Social Interaction Social Interaction: 2-Interacts appropriately 25 - 49% of time - Needs frequent redirection.  Problem Solving Problem Solving: 1-Solves basic less than 25% of the time - needs direction nearly all the time or does not effectively solve problems and may need a restraint for safety  Memory Memory: 1-Recognizes or recalls less than 25% of the time/requires cueing greater than 75% of the time Medical Problem List and Plan: 1. Functional deficits secondary to TBI, cervical and thoracic spine fractures.   [facility]  -TLSO for better fit  -Surgery requests that pt be in [place] and tl[place] for at least 3 months. Can be up to 30 degrees without TLSO 2. DVT Prophylaxis/Anticoagulation: Pharmaceutical: Lovenox 3. Pain Management: Will continue oxycodone bid with prn doses as needed. Lidocain patch for neck pain. 4. H/o anxiety disorder/Mood: Will continue xanax prn for now. Patient has poor insight and lacks awareness of deficits. LCSW to follow along for support and evaluation when indicated.  5. Neuropsych: This patient is not capable of making decisions on his own behalf. 6. Skin/Wound Care: Pressure relief measures. Continue air mattress overlay. Added Eucerin cream for dry skin bilateral feet.  7. Fluids/Electrolytes/Nutrition: Monitor I/O. Electrolytes/bun/cr all look reasonable  -continue TF-on bolus schedule   8. ABLA:  hgb 11.5 9. Pulmonary: trach out. Stoma closing.    LOS (Days) 8 A FACE TO FACE EVALUATION WAS PERFORMED  Calandria Mullings T 07/17/2014 8:15 AM

## 2014-07-17 NOTE — Progress Notes (Signed)
Speech Language Pathology Weekly Progress and Session Note  Patient Details  Name: Hunter Myers MRN: [id number] Date of Birth: [birth year]  Beginning of progress report period: July 10, 2014 End of progress report period: July 17, 2014  Today's Date: 07/17/2014 SLP Individual Time: 1405-1420 SLP Individual Time Calculation (min): 15 min  Short Term Goals: Week 1: SLP Short Term Goal 1 (Week 1): Patient will demonstrate sustained attention to a functional and familiar task for 5 minutes with Max A multimodal cues cues.  SLP Short Term Goal 1 - Progress (Week 1): Progressing toward goal SLP Short Term Goal 2 (Week 1): Patient will orient to time, place and situation with Min A multimodal cues.  SLP Short Term Goal 2 - Progress (Week 1): Progressing toward goal SLP Short Term Goal 3 (Week 1): Patient will identify 2 physical and 2 cognitive deficits with Max A multimodal cues.  SLP Short Term Goal 3 - Progress (Week 1): Progressing toward goal SLP Short Term Goal 4 (Week 1): Patient will peform pharyngeal strengthening exercises with Max A multimodal cues.  SLP Short Term Goal 4 - Progress (Week 1): Progressing toward goal SLP Short Term Goal 5 (Week 1): Patient will consume trials of ice chips with minimal overt s/s of aspiration with Max A mutlimodal cues.  SLP Short Term Goal 5 - Progress (Week 1): Progressing toward goal SLP Short Term Goal 6 (Week 1): Patient will utilize an increased vocal intensity at the phrase level with Max A multimodal cues.  SLP Short Term Goal 6 - Progress (Week 1): Progressing toward goal    New Short Term Goals: Week 2: SLP Short Term Goal 1 (Week 2): Patient will demonstrate sustained attention to a functional and familiar task for 5 minutes with Max A multimodal cues cues.  SLP Short Term Goal 2 (Week 2): Patient will orient to time, place and situation with Min A multimodal cues.  SLP Short Term Goal 3 (Week 2): Patient will identify 2 physical and  2 cognitive deficits with Max A multimodal cues.  SLP Short Term Goal 4 (Week 2): Patient will peform pharyngeal strengthening exercises with Max A multimodal cues.  SLP Short Term Goal 5 (Week 2): Patient will consume trials of ice chips with minimal overt s/s of aspiration with Max A mutlimodal cues.  SLP Short Term Goal 6 (Week 2): Patient will utilize an increased vocal intensity at the phrase level with Max A multimodal cues.   Weekly Progress Updates: Patient has made inconsistent functional gains this reporting period and as a result did not meet his 6 short term goals.  While patient intermittently met goals for orientation, sustained attention and intellectual awareness he did not consistently meet goals and as a result, goals are being continued into the next reporting period.  Currently, patient continues to require Mod-Max assist for basic problem solving and continues to be NPO.  Additionally, patient and family education is ongoing. Patient would benefit from continued skilled SLP intervention to maximize cognitive-linguistic abilities and swallow function in order to maximize his overall independence prior to discharge.    Intensity: Minumum of 1-2 x/day, 30 to 90 minutes Frequency: 5 out of 7 days Duration/Length of Stay: 21-24 days  Treatment/Interventions: Cognitive remediation/compensation;Cueing hierarchy;Dysphagia/aspiration precaution training;Environmental controls;Functional tasks;Internal/external aids;Patient/family education;Speech/Language facilitation;Therapeutic Activities   Daily Session  Skilled Therapeutic Interventions:  Goals of skilled treatment session were to address dysphagia and cognitive goals. Upon arrival, patient asleep in bed; SLP facilitated session with Total assist environmental, verbal, visual  and tactile cues for arousal.  Patient demonstrated fleeting arousal, 1-2 seconds and then dosed off again.  SLP also facilitated session with Total assist  oral care, which patient slept through.  As a result, session was ended 45 minutes early and SLP communicated patient's decreased arousal to RN who reported that she was aware and suspected it was due to medication.  Continue with current plan of care.   FIM:  Comprehension Comprehension Mode: Auditory Comprehension: 1-Understands basic less than 25% of the time/requires cueing 75% of the time Expression Expression Mode: Verbal Expression: 2-Expresses basic 25 - 49% of the time/requires cueing 50 - 75% of the time. Uses single words/gestures. Social Interaction Social Interaction: 2-Interacts appropriately 25 - 49% of time - Needs frequent redirection. Problem Solving Problem Solving: 1-Solves basic less than 25% of the time - needs direction nearly all the time or does not effectively solve problems and may need a restraint for safety Memory Memory: 1-Recognizes or recalls less than 25% of the time/requires cueing greater than 75% of the time FIM - Eating Eating Activity: 0: Activity did not occur General    Pain Pain Assessment Pain Assessment: No/denies pain Pain Score: Asleep  Therapy/Group: Individual Therapy  Carmelia Roller., [place] [phone]  [place] 07/17/2014, 3:18 PM

## 2014-07-17 NOTE — Progress Notes (Signed)
Occupational Therapy Weekly Progress Note  Patient Details  Name: Hunter Myers MRN: [id number] Date of Birth: 1945/11/21  Beginning of progress report period: July 10, 2014 End of progress report period: July 17, 2014  Today's Date: 07/17/2014 OT Individual Time: 0930-1030 and 1300-1330 OT Individual Time Calculation (min): 60 min and 30 min Missed 30 min due to decreased arousal and fatigue    Patient has met 3 of 4 short term goals.  Patient has made good progress during this reporting period. Patient currently requires mod-max assist functional transfers and mod-max for standing balance during self-care tasks. Patient requires SBA-max assist overall for dynamic sitting balance. Patient requires mod-max cues for sustained attention during self-care tasks and therapeutic activities, as he attempts to remove cervical brace throughout sessions. [name] greatest barriers at this time are decreased strength, poor postural control, decreased attention, and decreased activity tolerance. Patient is currently demonstrating behaviors consistent with [place].   Patient continues to demonstrate the following deficits: decreased balance, decreased activity tolerance, decreased postural control, decreased strength, decreased functional use of BUEs and BLEs, decreased awareness, impaired sustained attention, decreased safety awareness, decreased problem solving, decreased knowledge and adherence to precautions, decreased strength, decreased coordination and therefore will continue to benefit from skilled OT intervention to enhance overall performance with BADLs, balance, awareness, activity tolerance, ability to compensate for deficits, attention.  Patient progressing toward long term goals..  Continue plan of care.  OT Short Term Goals Week 1:  OT Short Term Goal 1 (Week 1): Pt will complete toilet transfer with max assist +1 OT Short Term Goal 1 - Progress (Week 1): Met OT Short Term  Goal 2 (Week 1): Pt will complete LB dressing with max assist  OT Short Term Goal 2 - Progress (Week 1): Not met OT Short Term Goal 3 (Week 1): Pt will consistently be oriented x3 with min cues OT Short Term Goal 3 - Progress (Week 1): Met OT Short Term Goal 4 (Week 1): Pt will complete bathing with mod assist for washing body parts and max assist sit<>stand OT Short Term Goal 4 - Progress (Week 1): Met Week 2:  OT Short Term Goal 1 (Week 2): Pt will complete toilet task with max assist  OT Short Term Goal 2 (Week 2): Pt will demonstrate sustained attention to self-care tasks with min cues OT Short Term Goal 3 (Week 2): Pt will complete LB dressing with max assist OT Short Term Goal 4 (Week 2): Pt will complete toilet transfer with min assist   Skilled Therapeutic Interventions/Progress Updates:    Session 1: Pt seen for ADL retraining with focus on attention, activity tolerance, functional transfers, and standing balance. Pt received sitting in w/c at nurses station requesting to return to bed, however easily redirected to therapy. Pt oriented x3 with min cues. Completed stand pivot transfer w/c<>toilet with mod assist using grab bars to assist. Required total assist for clothing management with pt assisting ~25% of task. Engaged in bathing and dressing from w/c at sink. Pt required min cues for initiation and sequencing. Completed sit<>stand at sink with mod assist then pt required max assist for standing balance with manual facilitation provided for postural control. Pt with no clothes at this time therefore donning gown. Therapist provided max assist for crossover technique with pt assisting with donning shoes and socks ~25% of tasks. Pt required mod cues throughout session for sustained attention as he was pulling on cervical brace multiple times.   Session 2: Pt seen for  1:1 OT session with focus on activity tolerance, bed mobility, and overall participation. Pt received supine in bed asleep with  family present. Utilized external stimuli (lighting, cool wash cloth, bed elevation) and sternal rub with minimal arousal noted. Donned TLSO via log rolling left and right with max assist. Completed supine>sit with total assist and pt with minimal eye opening. Pt responding to family, stating approx 5 words, opening eyes 3-4x for only a couple seconds. Pt required min-total assist while sitting EOB and unable to arouse despite max multimodal cues. Returned pt to supine and doffed TLSO. Pt left supine in bed with HOB elevated and all needs in reach. Pt missing 30 min skilled OT d/t decreased arousal. Notified RN of missed therapy minutes. Will follow-up as able.   Therapy Documentation Precautions:  Precautions Precautions: Fall, Cervical, Back Precaution Comments: NPO with PEG Required Braces or Orthoses: Cervical Brace, Spinal Brace Cervical Brace: Hard collar Spinal Brace: Thoracolumbosacral orthotic, Applied in supine position Restrictions Weight Bearing Restrictions: No General:   Vital Signs: Therapy Vitals Temp: 97.7 F (36.5 C) Temp Source: Oral Pulse Rate: 78 Resp: 16 BP: 133/79 mmHg Patient Position (if appropriate): Lying Oxygen Therapy SpO2: 99 % O2 Device: Not Delivered Pain: Pain Assessment Pain Assessment: 0-10 Pain Score: Asleep Pain Location: Head Pain Orientation: Anterior Pain Descriptors / Indicators: Aching Multiple Pain Sites: No  See FIM for current functional status  Therapy/Group: Individual Therapy  Duayne Cal 07/17/2014, 7:23 AM

## 2014-07-17 NOTE — Progress Notes (Signed)
Physical Therapy Session Note  Patient Details  Name: Dayton MartesDwight Loser MRN: [id number] Date of Birth: 07/07/1946  Today's Date: 07/17/2014 PT Individual Time: 0915-0930 PT Individual Time Calculation (min): 15 min MAKE UP SESSION  Short Term Goals: Week 2:  PT Short Term Goal 1 (Week 2): Patient will perform bed mobility from flat surface without bedrails and minA. PT Short Term Goal 2 (Week 2): Patient will perform functional transfers with modA consistently. PT Short Term Goal 3 (Week 2): Patient will perform gait training x50' with LRAD and modA. PT Short Term Goal 4 (Week 2): Patient will negotiate 5 steps with B handrails and modA. PT Short Term Goal 5 (Week 2): Patient will demonstrate carry over within session with mobility techniques with mod cues.  Skilled Therapeutic Interventions/Progress Updates:   Pt received in w/c at RN station, agreeable to therapy. Patient performed multiple stand pivot transfers to R and L with max A from therapist and multiple squat pivot transfers to R and L with min-mod A from w/c <> mat table. Patient requires verbal cues for hand placement and technique. Pt propelled w/c using BUE x 75 ft with supervision and verbal cues for bigger excursion on wheel rim and sequencing during turns. Pt left seated at RN station with quick release belt and B mitts in place.   Therapy Documentation Precautions:  Precautions Precautions: Fall, Cervical, Back Precaution Comments: NPO with PEG Required Braces or Orthoses: Cervical Brace, Spinal Brace Cervical Brace: Hard collar Spinal Brace: Thoracolumbosacral orthotic, Applied in supine position Restrictions Weight Bearing Restrictions: No Pain: Pain Assessment Pain Assessment: No/denies pain Pain Score: 0-No pain  See FIM for current functional status  Therapy/Group: Individual Therapy  Kerney ElbeVarner, Letta Cargile A 07/17/2014, 10:03 AM

## 2014-07-17 NOTE — Progress Notes (Signed)
Occupational Therapy Session Note  Patient Details  Name: Hunter Myers MRN: [id number] Date of Birth: 14-Aug-1946  Today's Date: 07/17/2014 OT Individual Time: 1100-1130 OT Individual Time Calculation (min): 30 min    Short Term Goals: Week 1:  OT Short Term Goal 1 (Week 1): Pt will complete toilet transfer with max assist +1 OT Short Term Goal 1 - Progress (Week 1): Met OT Short Term Goal 2 (Week 1): Pt will complete LB dressing with max assist  OT Short Term Goal 2 - Progress (Week 1): Not met OT Short Term Goal 3 (Week 1): Pt will consistently be oriented x3 with min cues OT Short Term Goal 3 - Progress (Week 1): Met OT Short Term Goal 4 (Week 1): Pt will complete bathing with mod assist for washing body parts and max assist sit<>stand OT Short Term Goal 4 - Progress (Week 1): Met  Skilled Therapeutic Interventions/Progress Updates:    Pt sitting in w/c at nurses station upon arrival.  Pt transitioned to therapy gym for unsupported sitting balance activities.  Pt performed squat pivot transfer with mod A to therapy gym.  Pt engaged in reaching with BUE to retrieve items from table.  Pt exhibited difficulty reaching when ROM approached 90 degrees.Pt c/o increased pain in mid/lower back with sitting unsupported and returned to w/c and back to bed per RN request.  Pt requested ice chips X 4.  Therapist explained that nursing would assist with ice chips later.  Pt required mod A for squat pivot transfer to bed and tot A for sit<>supine.  Pt rolled in bed with min A to facilitate removal of TLSO.  Pt remained in bed with mits in place. Focus on activity tolerance, unsupported sitting balance, sit<>stand, transfers, and safety awareness.  Therapy Documentation Precautions:  Precautions Precautions: Fall, Cervical, Back Precaution Comments: NPO with PEG Required Braces or Orthoses: Cervical Brace, Spinal Brace Cervical Brace: Hard collar Spinal Brace: Thoracolumbosacral orthotic, Applied in  supine position Restrictions Weight Bearing Restrictions: No Pain: Pain Assessment Pain Assessment: 0-10 Pain Score: 7  Pain Type: Acute pain Pain Location: Back Pain Orientation: Mid;Lower Pain Descriptors / Indicators: Aching Pain Onset: On-going Pain Intervention(s): RN made aware;Repositioned  See FIM for current functional status  Therapy/Group: Individual Therapy  Leroy Libman 07/17/2014, 11:38 AM

## 2014-07-17 NOTE — Progress Notes (Signed)
Physical Therapy Weekly Progress Note  Patient Details  Name: Holman Bonsignore MRN: [id number] Date of Birth: [birth year]  Beginning of progress report period: July 10, 2014 End of progress report period: July 17, 2014  Today's Date: 07/17/2014 PT Individual Time: 0800-0900 PT Individual Time Calculation (min): 60 min   Patient has met 4 of 5 short term goals. Patient has made good progress during first reporting period on rehab. Patient currently requires mod-maxA for functional transfers, including bed mobility, and min-maxA x2 for gait training and stair negotiation. Patient requires SBA-maxA overall for dynamic sitting balance due to fluctuating levels of fatigue and attention to task. Patient requires mod-max cues for sustained attention during functional mobility and therapeutic activities, as he attempts to remove cervical brace throughout sessions. [name] greatest barriers at this time are decreased strength, poor postural control, decreased attention, and decreased activity tolerance. Patient is currently demonstrating behaviors consistent with [place] Level V.   Patient continues to demonstrate the following deficits: decreased activity tolerance, poor balance and balance strategies, decreased postural control, decreased ability to compensate for deficits, decreased functional use of B LEs, decreased sustained attention, decreased awareness, decreased coordination, and decreased knowledge of and adherence to precautions and therefore will continue to benefit from skilled PT intervention to enhance overall performance with activity tolerance, balance, postural control, ability to compensate for deficits, functional use of  right lower extremity and left lower extremity, attention, awareness, coordination and knowledge of precautions.  Patient progressing toward long term goals..  Continue plan of care.  PT Short Term Goals Week 1:  PT Short Term Goal 1 (Week 1): Patient will perform  bed mobility with use of hospital bed functions and modA. PT Short Term Goal 1 - Progress (Week 1): Met PT Short Term Goal 2 (Week 1): Patient will perform functional transfers with maxA x1. PT Short Term Goal 2 - Progress (Week 1): Met PT Short Term Goal 3 (Week 1): Patient will perform gait training 2' with +2 assist. PT Short Term Goal 3 - Progress (Week 1): Met PT Short Term Goal 4 (Week 1): Patient will negotiate 2 steps with B handrails and +2 assist. PT Short Term Goal 4 - Progress (Week 1): Met PT Short Term Goal 5 (Week 1): Patient will demonstrate carry over within session with mobility techniques with mod cues. PT Short Term Goal 5 - Progress (Week 1): Progressing toward goal Week 2:  PT Short Term Goal 1 (Week 2): Patient will perform bed mobility from flat surface without bedrails and minA. PT Short Term Goal 2 (Week 2): Patient will perform functional transfers with modA consistently. PT Short Term Goal 3 (Week 2): Patient will perform gait training x50' with LRAD and [place]. PT Short Term Goal 4 (Week 2): Patient will negotiate 5 steps with B handrails and modA. PT Short Term Goal 5 (Week 2): Patient will demonstrate carry over within session with mobility techniques with mod cues.  Skilled Therapeutic Interventions/Progress Updates:    Session focused on bed mobility, functional transfers, wheelchair mobility, gait training, and stair negotiation. Patient performed rolling to B sides in bed with emphasis on proper sequencing and technique of log roll to don TLSO, use of bed rails and minA, supine>sit with minA. Patient performed scooting along edge of bed with close supervision in preparation for squat pivot transfer bed>wheelchair with modA. Wheelchair mobility with B UEs x150' and minA for negotiation of obstacles.  R toe off AFO donned for gait training. Gait training in controlled environment x20'  with RW and mod-maxA overall secondary to posterior lean and decreased ability to  advance and properly place R LE, +2 for wheelchair follow. Patient requires manual facilitation to increase BOS due to tandem walking/intermittnent scissoring. No instances of knee buckling, poor anterior progression over R LE during stance. Repeated sit<>stands x4 with emphasis on proper hand placement/foot positioning with modA due to posterior lean. Patient left sitting at RN station with seatbelt donned and mittens donned.  Therapy Documentation Precautions:  Precautions Precautions: Fall, Cervical, Back Precaution Comments: NPO with PEG Required Braces or Orthoses: Cervical Brace, Spinal Brace Cervical Brace: Hard collar Spinal Brace: Thoracolumbosacral orthotic, Applied in supine position Restrictions Weight Bearing Restrictions: No Pain: Pain Assessment Pain Assessment: No/denies pain Pain Score: 0-No pain Locomotion : Ambulation Ambulation/Gait Assistance: 1: +2 Total assist;3: Mod assist;2: Max assist (+2 for w/c follow) Wheelchair Mobility Distance: 150   See FIM for current functional status  Therapy/Group: Individual Therapy   S    S. , PT, DPT 07/17/2014, 10:00 AM   
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
## 2014-07-18 ENCOUNTER — Inpatient Hospital Stay (HOSPITAL_COMMUNITY): Payer: Medicare Other | Admitting: Occupational Therapy

## 2014-07-18 DIAGNOSIS — Z931 Gastrostomy status: Secondary | ICD-10-CM

## 2014-07-18 LAB — GLUCOSE, CAPILLARY
GLUCOSE-CAPILLARY: 129 mg/dL — AB (ref 70–99)
GLUCOSE-CAPILLARY: 99 mg/dL (ref 70–99)
Glucose-Capillary: 136 mg/dL — ABNORMAL HIGH (ref 70–99)

## 2014-07-18 MED ORDER — GABAPENTIN 100 MG PO CAPS
100.0000 mg | ORAL_CAPSULE | Freq: Every day | ORAL | Status: DC
  Administered 2014-07-19 (×3): 100 mg via ORAL
  Filled 2014-07-18 (×4): qty 1

## 2014-07-18 NOTE — Progress Notes (Signed)
[facility]     PROGRESS NOTE    Subjective/Complaints: Patient complains of burning pain in feet at night. Not as bad during the day. No other complaints.  ROS limited by mental status  Objective: Vital Signs: Blood pressure 135/92, pulse 78, temperature 97.7 F (36.5 C), temperature source Axillary, resp. rate 18, height 5\' 10"  (1.778 m), weight 53.071 kg (117 lb), SpO2 99 %. No results found.  Recent Labs  07/16/14 0418  WBC 5.8  HGB 11.1*  HCT 34.6*  PLT 273    Recent Labs  07/16/14 0418  NA 138  K 4.0  CL 99  GLUCOSE 77  BUN 20  CREATININE 0.58  CALCIUM 9.2   CBG (last 3)  No results for input(s): GLUCAP in the last 72 hours.  Wt Readings from Last 3 Encounters:  07/18/14 53.071 kg (117 lb)  07/09/14 58.514 kg (129 lb)    Physical Exam:   Constitutional: He appears well-developed. He appears cachectic. He has a sickly appearance. Cervical collar loose. HENT: Voice is hoarse but air leakage through trach, Head: Normocephalic and atraumatic.  Eyes: Conjunctivae are normal. Pupils are equal, round, and reactive to light.  Neck: air escaping from stoma--area clean. Cardiovascular: Regular rhythm.  Respiratory: Effort normal and breath sounds normal. No respiratory distress. He has no wheezes.  GI: Soft. Bowel sounds are normal. He exhibits no distension.  PEG site clean and dry. Mild tenderness around the PEG site Musculoskeletal: He exhibits no edema.  Unable to raise left shoulder.  Neurological: He is alert.  Oriented to self. Lacks awareness and insight into current deficits. Less Restless.  + dysphonia.    3+ prox to 4/5 distally in UE's. LE: Left HF 3/5. RHF 2/5. 3 minus bilateral ankle dorsiflexors Skin: Skin is warm and dry. Mild erythema around the PICC site right medial arm Bilateral heels with dry flaky patches.   Assessment/Plan: 1. 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 Physiatrist is providing close team supervision and 24 hour management of active medical problems listed below. Physiatrist and rehab team continue to assess barriers to discharge/monitor patient progress toward functional and medical goals. FIM: FIM - Bathing Bathing Steps Patient Completed: Chest, Right Arm, Left Arm, Abdomen, Front perineal area, Right upper leg, Left upper leg, Buttocks Bathing: 2: Max-Patient completes 3-4 6071f 10 parts or 25-49% (max assist standing balance)  FIM - Upper Body Dressing/Undressing Upper body dressing/undressing steps patient completed: Thread/unthread right sleeve of pullover shirt/dresss, Thread/unthread left sleeve of pullover shirt/dress Upper body dressing/undressing: 0: Wears gown/pajamas-no public clothing FIM - Lower Body Dressing/Undressing Lower body dressing/undressing steps patient completed: Fasten/unfasten right shoe Lower body dressing/undressing: 1: Total-Patient completed less than 25% of tasks  FIM - Toileting Toileting steps completed by patient: Adjust clothing prior to toileting, Adjust clothing after toileting Toileting Assistive Devices: Grab bar or rail for support Toileting: 1: Total-Patient completed zero steps, helper did all 3  FIM - Diplomatic Services operational officerToilet Transfers Toilet Transfers Assistive Devices: Grab bars Toilet Transfers: 3-From toilet/BSC: Mod A (lift or lower assist), 3-To toilet/BSC: Mod A (lift or lower assist)  FIM - BankerBed/Chair Transfer Bed/Chair Transfer Assistive Devices: Arm rests, Bed rails, Orthosis (R toe off AFO) Bed/Chair Transfer: 2: Supine > Sit: Max A (lifting assist/Pt. 25-49%), 3: Bed > Chair or W/C: Mod A (lift or lower assist), 3: Chair or  W/C > Bed: Mod A (lift or lower assist)  FIM - Locomotion: Wheelchair Distance: 150 Locomotion: Wheelchair: 4: Travels 150 ft or more:  maneuvers on rugs and over door sillls with minimal assistance (Pt.>75%) FIM - Locomotion: Ambulation Locomotion: Ambulation Assistive Devices: Designer, industrial/productWalker - Rolling Ambulation/Gait Assistance: 1: +2 Total assist, 3: Mod assist, 2: Max assist (+2 for w/c follow) Locomotion: Ambulation: 1: Two helpers  Comprehension Comprehension Mode: Auditory Comprehension: 4-Understands basic 75 - 89% of the time/requires cueing 10 - 24% of the time  Expression Expression Mode: Verbal Expression: 3-Expresses basic 50 - 74% of the time/requires cueing 25 - 50% of the time. Needs to repeat parts of sentences.  Social Interaction Social Interaction: 4-Interacts appropriately 75 - 89% of the time - Needs redirection for appropriate language or to initiate interaction.  Problem Solving Problem Solving: 2-Solves basic 25 - 49% of the time - needs direction more than half the time to initiate, plan or complete simple activities  Memory Memory: 2-Recognizes or recalls 25 - 49% of the time/requires cueing 51 - 75% of the time Medical Problem List and Plan: 1. Functional deficits secondary to TBI, cervical and thoracic spine fractures.   [facility]  -TLSO for better fit  -Surgery requests that pt be in [place] and [place] for at least 3 months. Can be up to 30 degrees without TLSO 2. DVT Prophylaxis/Anticoagulation: Pharmaceutical: Lovenox 3. Pain Management: Will continue oxycodone bid with prn doses as needed. Lidocain patch for neck pain.probable neurogenic pain in bilateral feet trial of gabapentin at night 4. H/o anxiety disorder/Mood: Will continue xanax prn for now. Patient has poor insight and lacks awareness of deficits. LCSW to follow along for support and evaluation when indicated.  5. Neuropsych: This patient is not capable of making decisions on his own behalf. 6. Skin/Wound Care: Pressure relief measures. Continue air mattress overlay. Added Eucerin cream  for dry skin bilateral feet.  7. Fluids/Electrolytes/Nutrition: Monitor I/O. Electrolytes/bun/cr all look reasonable  -continue TF-on bolus schedule   8. ABLA:  hgb 11.5 9. Pulmonary: trach out. Stoma closing.    LOS (Days) 9 A FACE TO FACE EVALUATION WAS PERFORMED  Claudette LawsKIRSTEINS,Fraya Ueda E 07/18/2014 11:14 AM

## 2014-07-18 NOTE — Progress Notes (Signed)
Patient non-compliant with cervical collar.  Continues to remove collar at times.  Poor safety awareness.  Family presence encouraged for optimal safety.

## 2014-07-18 NOTE — Plan of Care (Signed)
Problem: RH BOWEL ELIMINATION Goal: RH STG MANAGE BOWEL WITH ASSISTANCE STG Manage Bowel with max Assistance.  Outcome: Progressing Goal: RH STG MANAGE BOWEL W/MEDICATION W/ASSISTANCE STG Manage Bowel with Medication with max Assistance.  Outcome: Progressing  Problem: RH BLADDER ELIMINATION Goal: RH STG MANAGE BLADDER WITH ASSISTANCE STG Manage Bladder With max Assistance  Outcome: Progressing  Problem: RH SKIN INTEGRITY Goal: RH STG SKIN FREE OF INFECTION/BREAKDOWN Remain free form skin breakdown while on Rehab with max assist  Outcome: Progressing Goal: RH STG MAINTAIN SKIN INTEGRITY WITH ASSISTANCE STG Maintain Skin Integrity With mod Assistance.  Outcome: Progressing  Problem: RH SAFETY Goal: RH STG ADHERE TO SAFETY PRECAUTIONS W/ASSISTANCE/DEVICE STG Adhere to Safety Precautions With max Assistance/Device.  Outcome: Progressing Goal: RH STG DECREASED RISK OF FALL WITH ASSISTANCE STG Decreased Risk of Fall With max Assistance.  Outcome: Progressing  Problem: RH PAIN MANAGEMENT Goal: RH STG PAIN MANAGED AT OR BELOW PT'S PAIN GOAL Pain level less than 3  Outcome: Progressing

## 2014-07-18 NOTE — Progress Notes (Signed)
Occupational Therapy Session Note  Patient Details  Name: Hunter MartesDwight Myers MRN: [id number] Date of Birth: 01/14/1946  Today's Date: 07/18/2014 OT Individual Time: [id number] OT Individual Time Calculation (min): 60 min   Skilled Therapeutic Interventions/Progress Updates: Patient touching and verbalizing the whole session about his hard collar, TLSO, and the PEG tubing as he  focused on his neck collar and that "[facility] not [facility] one from here."  As well as requesting that "They said it is time for me to have another test and I can drink again."   This clincician padded his TLSO, neck collar, and around the hard, plastic portions of he PEG tubing as well as applying skin protect around hard edges of neck collar in attempt to help patient feel less irritation from these.   Though he was very focused on the aforementioned "pieces,"  He managed to wash some of his upper body, periarea while in bed and then transferred stand pivot with Mod A to his w/c.  Nurse was in formed of the "chaffing" and "pain" he voiced on his left trapezius between his neck and shoulder.  RN indicated she'd already given him a pain patch for the area.  Patient was left with safety belt in place with supervision at the nurse's station at the end of the session.    Therapy Documentation Precautions:  Precautions Precautions: Fall, Cervical, Back Precaution Comments: NPO with PEG Required Braces or Orthoses: Cervical Brace, Spinal Brace Cervical Brace: Hard collar Spinal Brace: Thoracolumbosacral orthotic, Applied in supine position Restrictions Weight Bearing Restrictions: No  Pain: this clinician was not able to get patient to verbalize  Pain level, but he c/o of "chaffing" on both sides of neck/chest/back from the hard collar and of "pain" on an area he described as the superior middle aspect of his left trapezius between neck and shoulder   See FIM for current functional  status  Therapy/Group: Individual Therapy  [name], [name] 07/18/2014, 4:16 PM

## 2014-07-19 ENCOUNTER — Inpatient Hospital Stay (HOSPITAL_COMMUNITY): Payer: Medicare Other | Admitting: Occupational Therapy

## 2014-07-19 DIAGNOSIS — S069X4D Unspecified intracranial injury with loss of consciousness of 6 hours to 24 hours, subsequent encounter: Principal | ICD-10-CM

## 2014-07-19 LAB — GLUCOSE, CAPILLARY
GLUCOSE-CAPILLARY: 123 mg/dL — AB (ref 70–99)
Glucose-Capillary: 137 mg/dL — ABNORMAL HIGH (ref 70–99)
Glucose-Capillary: 150 mg/dL — ABNORMAL HIGH (ref 70–99)
Glucose-Capillary: 77 mg/dL (ref 70–99)
Glucose-Capillary: 86 mg/dL (ref 70–99)
Glucose-Capillary: 89 mg/dL (ref 70–99)
Glucose-Capillary: 92 mg/dL (ref 70–99)

## 2014-07-19 NOTE — Progress Notes (Signed)
[facility]ABILITATION     PROGRESS NOTE    Subjective/Complaints: Patient complains of burning pain in feet at night. Not as bad during the day. No other complaints.  ROS limited by mental status  Objective: Vital Signs: Blood pressure 126/82, pulse 71, temperature 97.8 F (36.6 C), temperature source Oral, resp. rate 18, height 5\' 10"  (1.778 m), weight 53.978 kg (119 lb), SpO2 100 %. No results found. No results for input(s): WBC, HGB, HCT, PLT in the last 72 hours. No results for input(s): NA, K, CL, GLUCOSE, BUN, CREATININE, CALCIUM in the last 72 hours.  Invalid input(s): CO CBG (last 3)   Recent Labs  07/19/14 0002 07/19/14 0412 07/19/14 0827  GLUCAP 123* 86 77    Wt Readings from Last 3 Encounters:  07/19/14 53.978 kg (119 lb)  07/09/14 58.514 kg (129 lb)    Physical Exam:   Constitutional: He appears well-developed. He appears cachectic. He has a sickly appearance. Cervical collar loose. HENT: Voice is hoarse but air leakage through trach, Head: Normocephalic and atraumatic.  Eyes: Conjunctivae are normal. Pupils are equal, round, and reactive to light.  Neck: air escaping from stoma--area clean. Cardiovascular: Regular rhythm.  Respiratory: Effort normal and breath sounds normal. No respiratory distress. He has no wheezes.  GI: Soft. Bowel sounds are normal. He exhibits no distension.  PEG site clean and dry. Mild tenderness around the PEG site Musculoskeletal: He exhibits no edema.  Unable to raise left shoulder.  Neurological: He is alert.  Oriented to self. Lacks awareness and insight into current deficits. Less Restless.  + dysphonia.    3+ prox to 4/5 distally in UE's. LE: Left HF 3/5. RHF 2/5. 3 minus bilateral ankle dorsiflexors Skin: Skin is warm and dry. Mild erythema around the PICC site right medial arm Bilateral heels with dry flaky patches.   Assessment/Plan: 1. 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 Physiatrist is providing close team supervision and 24 hour management of active medical problems listed below. Physiatrist and rehab team continue to assess barriers to discharge/monitor patient progress toward functional and medical goals. FIM: FIM - Bathing Bathing Steps Patient Completed: Chest, Right Arm, Left Arm, Abdomen, Front perineal area Bathing: 3: Mod-Patient completes 5-7 4866f 10 parts or 50-74%  FIM - Upper Body Dressing/Undressing Upper body dressing/undressing steps patient completed: Thread/unthread right sleeve of pullover shirt/dresss, Thread/unthread left sleeve of pullover shirt/dress Upper body dressing/undressing: 0: Wears gown/pajamas-no public clothing FIM - Lower Body Dressing/Undressing Lower body dressing/undressing steps patient completed: Fasten/unfasten right shoe Lower body dressing/undressing: 1: Total-Patient completed less than 25% of tasks  FIM - Toileting Toileting steps completed by patient: Adjust clothing prior to toileting, Adjust clothing after toileting Toileting Assistive Devices: Grab bar or rail for support Toileting: 0: Activity did not occur  FIM - Diplomatic Services operational officerToilet Transfers Toilet Transfers Assistive Devices: Therapist, musicGrab bars Toilet Transfers: 0-Activity did not occur  FIM - BankerBed/Chair Transfer Bed/Chair Transfer Assistive Devices: Arm rests, Bed rails, Orthosis (R toe off AFO) Bed/Chair Transfer: 2: Supine > Sit: Max A (lifting assist/Pt. 25-49%), 3: Bed > Chair or W/C: Mod A (lift or lower assist)  FIM - Locomotion: Wheelchair Distance: 150 Locomotion: Wheelchair: 4: Travels 150 ft or more: maneuvers on rugs and over door sillls with minimal assistance (Pt.>75%) FIM - Locomotion: Ambulation Locomotion: Ambulation Assistive Devices: Designer, industrial/productWalker - Rolling Ambulation/Gait Assistance:  1: +2 Total  assist, 3: Mod assist, 2: Max assist (+2 for w/c follow) Locomotion: Ambulation: 1: Two helpers  Comprehension Comprehension Mode: Auditory Comprehension: 4-Understands basic 75 - 89% of the time/requires cueing 10 - 24% of the time  Expression Expression Mode: Verbal Expression: 3-Expresses basic 50 - 74% of the time/requires cueing 25 - 50% of the time. Needs to repeat parts of sentences.  Social Interaction Social Interaction: 4-Interacts appropriately 75 - 89% of the time - Needs redirection for appropriate language or to initiate interaction.  Problem Solving Problem Solving: 1-Solves basic less than 25% of the time - needs direction nearly all the time or does not effectively solve problems and may need a restraint for safety  Memory Memory: 2-Recognizes or recalls 25 - 49% of the time/requires cueing 51 - 75% of the time Medical Problem List and Plan: 1. Functional deficits secondary to TBI, cervical and thoracic spine fractures.   [facility]  -TLSO for better fit  -Surgery [place] be [place] for at least 3 months. Can be up to 30 degrees without TLSO 2. DVT Prophylaxis/Anticoagulation: Pharmaceutical: Lovenox 3. Pain Management: Will continue oxycodone bid with prn doses as needed. Lidocain patch for neck pain.probable neurogenic pain in bilateral feet trial of gabapentin at night 4. H/o anxiety disorder/Mood: Will continue xanax prn for now. Patient has poor insight and lacks awareness of deficits. LCSW to follow along for support and evaluation when indicated.  5. Neuropsych: This patient is not capable of making decisions on his own behalf. 6. Skin/Wound Care: Pressure relief measures. Continue air mattress overlay. Added Eucerin cream for dry skin bilateral feet.  7. Fluids/Electrolytes/Nutrition: Monitor I/O. Electrolytes/bun/cr all look reasonable  -continue TF-on bolus schedule   8. ABLA:  hgb 11.5 9.  Pulmonary: trach out. Stoma closing.   10.  Severe dysphagia, NPO, cont SLP LOS (Days) 10 A FACE TO FACE EVALUATION WAS PERFORMED  Claudette LawsKIRSTEINS,Maty Zeisler E 07/19/2014 9:29 AM

## 2014-07-19 NOTE — Progress Notes (Signed)
[place] PHYSICAL MEDICINE & REHABILITATION     PROGRESS NOTE    Subjective/Complaints: Patient states burning pain in feet at night better, received gabapentin last noc  ROS limited by mental status  Objective: Vital Signs: Blood pressure 126/82, pulse 71, temperature 97.8 F (36.6 C), temperature source Oral, resp. rate 18, height 5\' 10"  (1.778 m), weight 53.978 kg (119 lb), SpO2 100 %. No results found. No results for input(s): WBC, HGB, HCT, PLT in the last 72 hours. No results for input(s): NA, K, CL, GLUCOSE, BUN, CREATININE, CALCIUM in the last 72 hours.  Invalid input(s): CO CBG (last 3)   Recent Labs  07/19/14 0002 07/19/14 0412 07/19/14 0827  GLUCAP 123* 86 77    Wt Readings from Last 3 Encounters:  07/19/14 53.978 kg (119 lb)  07/09/14 58.514 kg (129 lb)    Physical Exam:   Constitutional: He appears well-developed. He appears cachectic. He has a sickly appearance. Cervical collar loose. HENT: Voice is hoarse but air leakage through trach, Head: Normocephalic and atraumatic.  Eyes: Conjunctivae are normal. Pupils are equal, round, and reactive to light.  Neck: air escaping from stoma--area clean. Cardiovascular: Regular rhythm.  Respiratory: Effort normal and breath sounds normal. No respiratory distress. He has no wheezes.  GI: Soft. Bowel sounds are normal. He exhibits no distension.  PEG site clean and dry. Mild tenderness around the PEG site Musculoskeletal: He exhibits no edema.  Unable to raise left shoulder.  Neurological: He is alert.  Oriented to self. Lacks awareness and insight into current deficits. Less Restless.  + dysphonia.    3+ prox to 4/5 distally in UE's. LE: Left HF 3/5. RHF 2/5. 3 minus bilateral ankle dorsiflexors Skin: Skin is warm and dry. Mild erythema around the PICC site right medial arm Bilateral heels with dry flaky patches.   Assessment/Plan: 1. 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 Physiatrist is providing close team supervision and 24 hour management of active medical problems listed below. Physiatrist and rehab team continue to assess barriers to discharge/monitor patient progress toward functional and medical goals. FIM: FIM - Bathing Bathing Steps Patient Completed: Chest, Right Arm, Left Arm, Abdomen, Front perineal area Bathing: 3: Mod-Patient completes 5-7 2871f 10 parts or 50-74%  FIM - Upper Body Dressing/Undressing Upper body dressing/undressing steps patient completed: Thread/unthread right sleeve of pullover shirt/dresss, Thread/unthread left sleeve of pullover shirt/dress Upper body dressing/undressing: 0: Wears gown/pajamas-no public clothing FIM - Lower Body Dressing/Undressing Lower body dressing/undressing steps patient completed: Fasten/unfasten right shoe Lower body dressing/undressing: 1: Total-Patient completed less than 25% of tasks  FIM - Toileting Toileting steps completed by patient: Adjust clothing prior to toileting, Adjust clothing after toileting Toileting Assistive Devices: Grab bar or rail for support Toileting: 0: Activity did not occur  FIM - Diplomatic Services operational officerToilet Transfers Toilet Transfers Assistive Devices: Therapist, musicGrab bars Toilet Transfers: 0-Activity did not occur  FIM - BankerBed/Chair Transfer Bed/Chair Transfer Assistive Devices: Arm rests, Bed rails, Orthosis (R toe off AFO) Bed/Chair Transfer: 2: Supine > Sit: Max A (lifting assist/Pt. 25-49%), 3: Bed > Chair or W/C: Mod A (lift or lower assist)  FIM - Locomotion: Wheelchair Distance: 150 Locomotion: Wheelchair: 4: Travels 150 ft or more: maneuvers on rugs and over door sillls with minimal assistance (Pt.>75%) FIM - Locomotion: Ambulation Locomotion: Ambulation Assistive Devices: Designer, industrial/productWalker - Rolling Ambulation/Gait Assistance: 1: +2 Total assist, 3:  Mod assist,  2: Max assist (+2 for w/c follow) Locomotion: Ambulation: 1: Two helpers  Comprehension Comprehension Mode: Auditory Comprehension: 4-Understands basic 75 - 89% of the time/requires cueing 10 - 24% of the time  Expression Expression Mode: Verbal Expression: 3-Expresses basic 50 - 74% of the time/requires cueing 25 - 50% of the time. Needs to repeat parts of sentences.  Social Interaction Social Interaction: 4-Interacts appropriately 75 - 89% of the time - Needs redirection for appropriate language or to initiate interaction.  Problem Solving Problem Solving: 1-Solves basic less than 25% of the time - needs direction nearly all the time or does not effectively solve problems and may need a restraint for safety  Memory Memory: 2-Recognizes or recalls 25 - 49% of the time/requires cueing 51 - 75% of the time Medical Problem List and Plan: 1. Functional deficits secondary to TBI, cervical and thoracic spine fractures.   [facility]  -TLSO for better fit  -Surgery [place] that [place] be in [place] for at least 3 months. Can be up to 30 degrees without TLSO 2. DVT Prophylaxis/Anticoagulation: Pharmaceutical: Lovenox 3. Pain Management: Will continue oxycodone bid with prn doses as needed. Lidocain patch for neck pain.probable neurogenic pain in bilateral feet trial of gabapentin at night 4. H/o anxiety disorder/Mood: Will continue xanax prn for now. Patient has poor insight and lacks awareness of deficits. LCSW to follow along for support and evaluation when indicated.  5. Neuropsych: This patient is not capable of making decisions on his own behalf. 6. Skin/Wound Care: Pressure relief measures. Continue air mattress overlay. Added Eucerin cream for dry skin bilateral feet.  7. Fluids/Electrolytes/Nutrition: Monitor I/O. Electrolytes/bun/cr all look reasonable  -continue TF-on bolus schedule   8. ABLA:  hgb 11.5 9. Pulmonary: trach out.  Stoma closing.    LOS (Days) 10 A FACE TO FACE EVALUATION WAS PERFORMED  Claudette LawsKIRSTEINS,Leanndra Pember E 07/19/2014 9:20 AM

## 2014-07-19 NOTE — Progress Notes (Signed)
Occupational Therapy Session Note  Patient Details  Name: Dayton MartesDwight Petrasek MRN: [id number] Date of Birth: 03/28/1946  Today's Date: 07/19/2014 OT Individual Time: 0830-0930 OT Individual Time Calculation (min): 60 min    Skilled Therapeutic Interventions/Progress Updates: ADL and toileting with focus on focus, oriientation and processing, transfers and lower extremity weight bearing.     Therapy Documentation Precautions:  Precautions Precautions: Fall, Cervical, Back Precaution Comments: NPO with PEG Required Braces or Orthoses: Cervical Brace, Spinal Brace Cervical Brace: Hard collar Spinal Brace: Thoracolumbosacral orthotic, Applied in supine position Restrictions Weight Bearing Restrictions: No   Pain: denied   See FIM for current functional status  Therapy/Group: Individual Therapy  [name], Good[place] [facility] [facility] 07/19/2014, 10:11 AM

## 2014-07-20 ENCOUNTER — Inpatient Hospital Stay (HOSPITAL_COMMUNITY): Payer: Medicare Other

## 2014-07-20 ENCOUNTER — Inpatient Hospital Stay (HOSPITAL_COMMUNITY): Payer: Medicare Other | Admitting: *Deleted

## 2014-07-20 ENCOUNTER — Inpatient Hospital Stay (HOSPITAL_COMMUNITY): Payer: Medicare Other | Admitting: Speech Pathology

## 2014-07-20 LAB — GLUCOSE, CAPILLARY
GLUCOSE-CAPILLARY: 53 mg/dL — AB (ref 70–99)
Glucose-Capillary: 146 mg/dL — ABNORMAL HIGH (ref 70–99)

## 2014-07-20 MED ORDER — GLUCOSE 40 % PO GEL: 1.0000 | ORAL | Status: DC | PRN

## 2014-07-20 MED ORDER — GABAPENTIN 250 MG/5ML PO SOLN
100.0000 mg | Freq: Every day | ORAL | Status: DC
  Administered 2014-07-20 – 2014-08-05 (×18): 100 mg via ORAL
  Filled 2014-07-20 (×23): qty 2

## 2014-07-20 NOTE — Progress Notes (Signed)
[name] bed alarm went off and patient was found sitting next to the side of his bed.  His cervical collar was on.  Patient states he was "trying to crawl away" and was irritated about the bilateral elbow immobilizers, which were ordered to prevent him from removing his cervical collar.  Vitals obtained; no new skin issues noted; patient denies pain.  Jacalyn LefevreEunice Thomas, NP was notified of fall; new order received for bilateral wrist restraints and soft waist belt.  [name] daughter, Johney Frameheresa Deyo, was notified of fall and states she will tell her mother, Scarlette CalicoFrances.  Will continue to monitor.

## 2014-07-20 NOTE — Progress Notes (Signed)
Occupational Therapy Session Note  Patient Details  Name: Hunter Myers MRN: [id number] Date of Birth: 02/15/1946  Today's Date: 07/20/2014 OT Individual Time: [id number] OT Individual Time Calculation (min): 60 min    Short Term Goals: Week 2:  OT Short Term Goal 1 (Week 2): Pt will complete toilet task with max assist  OT Short Term Goal 2 (Week 2): Pt will demonstrate sustained attention to self-care tasks with min cues OT Short Term Goal 3 (Week 2): Pt will complete LB dressing with max assist OT Short Term Goal 4 (Week 2): Pt will complete toilet transfer with min assist   Skilled Therapeutic Interventions/Progress Updates:   Pt seen for ADL retraining with focus on standing balance, functional transfers, sustained attention, and new learning. Pt received supine in bed. Completed shaving while supine in bed with pt sustaining attention up to 5 min without cues. Completed UB self-care while supine in bed with setup assist for bathing and mod assist dressing. Completed rolling L<>R in bed with min assist to don TLSO. Completed squat pivot transfer bed>w/c with mod assist and min cues for technique. Pt initiated request to use reacher for LB dressing, requiring max assist to complete and mod cues. Educated on use of sock-aid with pt demonstrating carryover of new learning with min cues. Completed hand hygiene and face washing in standing at sink with mod assist for balance and mod cues for postural control secondary to posterior lean. Pt required mod cues for sustained attention overall during therapy session as pt continues to pull at cervical collar. Pt left sitting in w/c at nurses station with all needs in reach.   Therapy Documentation Precautions:  Precautions Precautions: Fall, Cervical, Back Precaution Comments: NPO with PEG Required Braces or Orthoses: Cervical Brace, Spinal Brace Cervical Brace: Hard collar Spinal Brace: Thoracolumbosacral orthotic, Applied in supine  position Restrictions Weight Bearing Restrictions: No General:   Vital Signs:   Pain: Pt continues to report discomfort with cervical collar.  See FIM for current functional status  Therapy/Group: Individual Therapy  Zeph Riebel, Vara GuardianKayla N 07/20/2014, 10:03 AM

## 2014-07-20 NOTE — Progress Notes (Signed)
Patient able to remove mittens and continually tries to remove his cervical collar, which is to be on at all times.  Marissa NestlePam Love, PA aware and ordered bilateral elbow immobilizers to prevent removal of the collar.  Will continue to monitor.

## 2014-07-20 NOTE — Progress Notes (Signed)
Hypoglycemic Event  CBG: 53  Treatment: Tube feed given  Symptoms: None  Follow-up CBG: Time:N/A CBG Result:N/A  Possible Reasons for Event: Unknown  Comments/MD notified:Pam Love, PA notified of CBG; there is no current order for CBGs and Pam Love, PA orders to not obtain a follow-up CBG.  Nurse techs notified that patient is not a blood sugar.  Patient asymptomatic and received his scheduled tube feed.  Will continue to monitor.    Hunter Myers, Hunter Myers  Remember to initiate Hypoglycemia Order Set & complete

## 2014-07-20 NOTE — Progress Notes (Signed)
[facility] PHYSICAL MEDICINE & REHABILITATION     PROGRESS NOTE    Subjective/Complaints: No new issues. Wants to know therapy schedule for the day.  ROS limited by mental status  Objective: Vital Signs: Blood pressure 119/78, pulse 76, temperature 98.1 F (36.7 C), temperature source Oral, resp. rate 18, height 5\' 10"  (1.778 m), weight 53.978 kg (119 lb), SpO2 100 %. No results found. No results for input(s): WBC, HGB, HCT, PLT in the last 72 hours. No results for input(s): NA, K, CL, GLUCOSE, BUN, CREATININE, CALCIUM in the last 72 hours.  Invalid input(s): CO CBG (last 3)   Recent Labs  07/19/14 2347 07/20/14 0352 07/20/14 0810  GLUCAP 89 146* 53*    Wt Readings from Last 3 Encounters:  07/19/14 53.978 kg (119 lb)  07/09/14 58.514 kg (129 lb)    Physical Exam:   Constitutional: He appears well-developed. He appears cachectic. He has a sickly appearance. Cervical collar loose. HENT: Voice is hoarse but air leakage through trach, Head: Normocephalic and atraumatic.  Eyes: Conjunctivae are normal. Pupils are equal, round, and reactive to light.  Neck: air escaping from stoma--area clean. Cardiovascular: Regular rhythm.  Respiratory: Effort normal and breath sounds normal. No respiratory distress. He has no wheezes.  GI: Soft. Bowel sounds are normal. He exhibits no distension.  PEG site clean and dry. Mild tenderness around the PEG site Musculoskeletal: He exhibits no edema.  Unable to raise left shoulder.  Neurological: He is alert.  Oriented to self. Lacks awareness and insight into current deficits. Less Restless.  + dysphonia.    3+ prox to 4/5 distally in UE's. LE: Left HF 3/5. RHF 2/5. 3 minus bilateral ankle dorsiflexors Skin: Skin is warm and dry. Mild erythema around the PICC site right medial arm Bilateral heels with dry flaky patches.   Assessment/Plan: 1. Functional deficits secondary to traumatic brain injury with cervical and thoracic  spine fractures  which require 3+ hours per day of interdisciplinary therapy in a comprehensive inpatient rehab setting. Physiatrist is providing close team supervision and 24 hour management of active medical problems listed below. Physiatrist and rehab team continue to assess barriers to discharge/monitor patient progress toward functional and medical goals. FIM: FIM - Bathing Bathing Steps Patient Completed: Chest, Right Arm, Left Arm, Abdomen, Front perineal area, Right upper leg, Left upper leg Bathing: 4: Min-Patient completes 8-9 4749f 10 parts or 75+ percent  FIM - Upper Body Dressing/Undressing Upper body dressing/undressing steps patient completed: Thread/unthread right sleeve of pullover shirt/dresss, Thread/unthread left sleeve of pullover shirt/dress Upper body dressing/undressing: 1: Total-Patient completed less than 25% of tasks FIM - Lower Body Dressing/Undressing Lower body dressing/undressing steps patient completed: Fasten/unfasten right shoe Lower body dressing/undressing: 1: Total-Patient completed less than 25% of tasks  FIM - Toileting Toileting steps completed by patient: Adjust clothing prior to toileting, Adjust clothing after toileting Toileting Assistive Devices: Grab bar or rail for support Toileting: 1: Total-Patient completed zero steps, helper did all 3  FIM - Diplomatic Services operational officerToilet Transfers Toilet Transfers Assistive Devices: Therapist, musicGrab bars Toilet Transfers: 3-To toilet/BSC: Mod A (lift or lower assist), 3-From toilet/BSC: Mod A (lift or lower assist)  FIM - BankerBed/Chair Transfer Bed/Chair Transfer Assistive Devices: Arm rests, Bed rails, Orthosis (R toe off AFO) Bed/Chair Transfer: 3: Bed > Chair or W/C: Mod A (lift or lower assist)  FIM - Locomotion: Wheelchair Distance: 150 Locomotion: Wheelchair: 4: Travels 150 ft or more: maneuvers on rugs and over door sillls with minimal assistance (Pt.>75%) FIM - Locomotion:  Ambulation Locomotion: Ambulation Assistive Devices: DealerWalker -  Rolling Ambulation/Gait Assistance: 1: +2 Total assist, 3: Mod assist, 2: Max assist (+2 for w/c follow) Locomotion: Ambulation: 1: Two helpers  Comprehension Comprehension Mode: Auditory Comprehension: 4-Understands basic 75 - 89% of the time/requires cueing 10 - 24% of the time  Expression Expression Mode: Verbal Expression: 3-Expresses basic 50 - 74% of the time/requires cueing 25 - 50% of the time. Needs to repeat parts of sentences.  Social Interaction Social Interaction: 4-Interacts appropriately 75 - 89% of the time - Needs redirection for appropriate language or to initiate interaction.  Problem Solving Problem Solving: 1-Solves basic less than 25% of the time - needs direction nearly all the time or does not effectively solve problems and may need a restraint for safety  Memory Memory: 2-Recognizes or recalls 25 - 49% of the time/requires cueing 51 - 75% of the time Medical Problem List and Plan: 1. Functional deficits secondary to TBI, cervical and thoracic spine fractures.   -Surgery requests that [place] be in [place] and [place] for at least 3 months. Can be up to 30 degrees without TLSO 2. DVT Prophylaxis/Anticoagulation: Pharmaceutical: Lovenox 3. Pain Management: Will continue oxycodone bid with prn doses as needed. Lidocain patch for neck pain.probable neurogenic pain in bilateral feet trial of gabapentin at night 4. H/o anxiety disorder/Mood: Will continue xanax prn for now. Patient has poor insight and lacks awareness of deficits. LCSW to follow along for support and evaluation when indicated.  5. Neuropsych: This patient is not capable of making decisions on his own behalf. 6. Skin/Wound Care: Pressure relief measures. Continue air mattress overlay.  Eucerin cream for dry skin bilateral feet.  7. Fluids/Electrolytes/Nutrition: Monitor I/O. Electrolytes/bun/cr all look reasonable  -continue TF-on bolus schedule  8. ABLA:  hgb 11.5 9. Pulmonary: trach out. Stoma closing.    10.  Severe dysphagia, NPO, cont SLP 11. Low am cbg's  LOS (Days) 11 A FACE TO FACE EVALUATION WAS PERFORMED  SWARTZ,ZACHARY T 07/20/2014 8:16 AM

## 2014-07-20 NOTE — Progress Notes (Signed)
Physical Therapy Session Note  Patient Details  Name: Hunter MartesDwight Winsor MRN: [id number] Date of Birth: 09/04/1945  Today's Date: 07/20/2014 PT Individual Time: 1300-1400 1600-1645 PT Individual Time Calculation (min): 60 min and 45 min  Short Term Goals: Week 2:  PT Short Term Goal 1 (Week 2): Patient will perform bed mobility from flat surface without bedrails and minA. PT Short Term Goal 2 (Week 2): Patient will perform functional transfers with modA consistently. PT Short Term Goal 3 (Week 2): Patient will perform gait training x50' with LRAD and modA. PT Short Term Goal 4 (Week 2): Patient will negotiate 5 steps with B handrails and modA. PT Short Term Goal 5 (Week 2): Patient will demonstrate carry over within session with mobility techniques with mod cues.  Skilled Therapeutic Interventions/Progress Updates:   First session: Pt was found in room supine in bed with wife and daughter present. Session focused on bed mobility, functional mobility, and cognitive remediation. Pt performed bed mobility rolling L and R to don TLSO with min verbal cues to direct sequencing. Pt self-propelled to rehab gym 100' using B LE > B UE and LLE with supervision and multiple rest breaks. Pt performed multiple stand pivot t/f w/c<>gym mat with mod A and mod verbal cues for posture, stepping, and hand placement; pt has significant posterior lean with standing activities. Pt performed pipe tree (medium to high difficulty) activity while in standing frame during co-treatment with rec therapy to challenge attention and to focus on anterior weight shift/lean when standing upright; pt required min verbal cues to correct posture and min verbal cues for correct assembly of pipe arrangement. Pt was left at nurse's station with pink safety belt in place and mittens on to keep from grabbing cervical collar and TLSO.   Second session: Pt found supine in bed with nurse at bedside. Session focused on bed mobility, functional  mobility, cognitive remediation, and dynamic sitting balance.  Pt performed bed mobility with close supervision and  min verbal cues for sequencing, rolling L and R, to help don TLSO. Pt self-propelled room>rehab gym using B UE and LLE 100' with supervision and min verbal cues for direction. Pt performed reaching activity, handing soccer ball off seated at EOM to challenge dynamic sitting balance. Pt performed B seated marching x10 , R knee extension with few second hold x5, and attempted R ankle DF/PF while seated at EOM. Performed multiple stand pivot t/f w/c <>bed with mod A and mod verbal cues for posture, stepping, and hand placement. Pt engaged on Nustep using B UE and B LEfor 6 minutes with resistance starting at 3 and reducing to 2 due to complaints of fatigue, Nustep challenging pt's sustained attention to task. Pt requires repeated max cues to keep from grabbing cervical collar and TLSO despite constant education on purpose and need for bracing. Pt was left at the nurse's station with pink safety belt in place and mittens on hands.    Therapy Documentation Precautions:  Precautions Precautions: Fall, Cervical, Back Precaution Comments: NPO with PEG Required Braces or Orthoses: Cervical Brace, Spinal Brace Cervical Brace: Hard collar Spinal Brace: Thoracolumbosacral orthotic, Applied in supine position Restrictions Weight Bearing Restrictions: No Vital Signs: Therapy Vitals Temp: 97.9 F (36.6 C) Temp Source: Oral Pulse Rate: 88 BP: (!) 129/91 mmHg Patient Position (if appropriate): Lying Oxygen Therapy SpO2: 99 % O2 Device: Not Delivered Pain: Pain Assessment Pain Assessment: No/denies pain Pain Score: 0-No pain Locomotion : Ambulation Ambulation/Gait Assistance: 1: +2 Total assist   See  FIM for current functional status  Therapy/Group: Individual Therapy  Caidyn Henricksen SPT 07/20/2014, 3:14 PM

## 2014-07-20 NOTE — Progress Notes (Signed)
   07/20/14 1929  What Happened  Was fall witnessed? No  Was patient injured? No  Patient found on floor  Found by Staff-comment Antony Contras(Mical Kicklighter, RN)  Stated prior activity other (comment) (Pt states he was trying to crawl away)  Follow Up  MD notified Jacalyn LefevreEunice Thomas  Time MD notified 2000  Family notified Yes-comment Johney Frame(Theresa Sison, Daughter, who will contact Berlinda LastFrances Freema)  Time family notified 2010  Additional tests No  Simple treatment Other (comment) (Application of restraints)  Progress note created (see row info) Yes  Adult Fall Risk Assessment  Risk Factor Category (scoring not indicated) Fall has occurred during this admission (document High fall risk)  [name] Fall Risk High Fall Risk (>13 points)  Adult Fall Risk Interventions  Required Bundle Interventions *See Row Information* High fall risk - low, moderate, and high requirements implemented  Additional Interventions Fall risk signage;Individualized elimination schedule;Reorient/diversional activities with confused patients;Secure all tubes/drains  Vitals  Temp 98.1 F (36.7 C)  Temp Source Oral  BP 138/83 mmHg  BP Location Right Arm  BP Method Automatic  Patient Position (if appropriate) Lying  Pulse Rate 82  Pulse Rate Source Dinamap  Resp 18  Oxygen Therapy  SpO2 99 %  O2 Device Room Air  Pain Assessment  Pain Assessment No/denies pain  Pain Score 0  Neurological  Neuro (WDL) X  Level of Consciousness Alert  Orientation Level Oriented to person;Disoriented to time;Disoriented to situation  Cognition Impulsive;Poor judgement;Poor safety awareness;Memory impairment  Speech Clear  Neuro Symptoms Forgetful  Musculoskeletal  Musculoskeletal (WDL) X  Assistive Device Wheelchair  Generalized Weakness Yes  Weight Bearing Restrictions No  Musculoskeletal Details  Trunk Ortho/Supportive Device  Trunk Ortho/Supportive Device Brace (Comment) (TLSO out of bed)  Neck Limited movement;Ortho/Supportive  Device;Injury/trauma  Neck Ortho/Supportive Device Collar  Collar On and aligned  Integumentary  Integumentary (WDL) X  Skin Color Pale;Other (Comment) (Blanchable redness to sacrum)  Skin Condition Dry  Skin Integrity Ecchymosis;Surgical Incision (see LDA)  Ecchymosis Location Abdomen  Ecchymosis Location Orientation Bilateral  Skin Turgor Non-tenting

## 2014-07-20 NOTE — Progress Notes (Signed)
Physical Therapy Session Note  Patient Details  Name: Hunter MartesDwight Myers MRN: [id number] Date of Birth: 11/01/1945  Today's Date: 07/20/2014 PT Individual Time: 1030-1115 PT Individual Time Calculation (min): 45 min   Short Term Goals: Week 2:  PT Short Term Goal 1 (Week 2): Patient will perform bed mobility from flat surface without bedrails and minA. PT Short Term Goal 2 (Week 2): Patient will perform functional transfers with modA consistently. PT Short Term Goal 3 (Week 2): Patient will perform gait training x50' with LRAD and modA. PT Short Term Goal 4 (Week 2): Patient will negotiate 5 steps with B handrails and modA. PT Short Term Goal 5 (Week 2): Patient will demonstrate carry over within session with mobility techniques with mod cues.  Skilled Therapeutic Interventions/Progress Updates:    Patient received sitting in wheelchair at RN station. Session focused on functional transfers and gait training. Patient initially with reports of needing to urinate. Transported back to room and performed sit<>stand with modA to stand and urinate into urinal, requires assistance for clothing management; continent of bladder. Patient then with c/o discomfort with PEG tube placement in TLSO. Adjusted so that PEG tube is supported with abdominal binder under TLSO. Patient then w/ c/o dry mouth and asking for water. Re-oriented to NPO status then assisted patient with oral care with suction pad.   In gym, gait training in controlled environment x85' with 3 musketeer style (patient's B UEs around each therapist's shoulders), minA x1, modA x1 required due to posterior trunk lean. Patient requires manual facilitation at R LE to increase BOS due to tandem walking/intermittnent scissoring. No instances of knee buckling. Patient returned to RN station and left sitting in wheelchair with seatbelt and mittens donned.   Therapy Documentation Precautions:  Precautions Precautions: Fall, Cervical, Back Precaution  Comments: NPO with PEG Required Braces or Orthoses: Cervical Brace, Spinal Brace Cervical Brace: Hard collar Spinal Brace: Thoracolumbosacral orthotic, Applied in supine position Restrictions Weight Bearing Restrictions: No Pain: Pain Assessment Pain Assessment: No/denies pain Pain Score: 0-No pain Locomotion : Ambulation Ambulation/Gait Assistance: 1: +2 Total assist   See FIM for current functional status  Therapy/Group: Individual Therapy  Chipper HerbBridget S Leslye Puccini S. Aurthur Wingerter, PT, DPT 07/20/2014, 12:24 PM

## 2014-07-20 NOTE — Plan of Care (Signed)
Problem: RH BOWEL ELIMINATION Goal: RH STG MANAGE BOWEL WITH ASSISTANCE STG Manage Bowel with max Assistance.  Outcome: Progressing Goal: RH STG MANAGE BOWEL W/MEDICATION W/ASSISTANCE STG Manage Bowel with Medication with max Assistance.  Outcome: Progressing  Problem: RH BLADDER ELIMINATION Goal: RH STG MANAGE BLADDER WITH ASSISTANCE STG Manage Bladder With max Assistance  Outcome: Progressing  Problem: RH SKIN INTEGRITY Goal: RH STG SKIN FREE OF INFECTION/BREAKDOWN Remain free form skin breakdown while on Rehab with max assist  Outcome: Progressing

## 2014-07-20 NOTE — Progress Notes (Signed)
Recreational Therapy Session Note  Patient Details  Name: [name] MRN: [id number] Date of Birth: 12/17/1945 Today's Date: 07/20/2014  Pain: no c/o Skilled Therapeutic Interventions/Progress Updates: Session focused on activity tolerance, standing tolerance/balance, selective attention in busy gym, & problem solving.  Pt stood in standing frame to complete moderately complex pipe tree projects using picture for guide.  Pt required min-mod cues for problem solving & mod assist for balance with focus on forward lean onto the table during task completion.   Therapy/Group: Co-Treatment  Vineta Carone 07/20/2014, 3:11 PM

## 2014-07-20 NOTE — Progress Notes (Signed)
Speech Language Pathology Daily Session Note  Patient Details  Name: Hunter Myers MRN: [id number] Date of Birth: 02/28/1946  Today's Date: 07/20/2014 SLP Individual Time: 1400-1500 SLP Individual Time Calculation (min): 60 min  Short Term Goals: Week 2: SLP Short Term Goal 1 (Week 2): Patient will demonstrate sustained attention to a functional and familiar task for 5 minutes with Max A multimodal cues cues.  SLP Short Term Goal 2 (Week 2): Patient will orient to time, place and situation with Min A multimodal cues.  SLP Short Term Goal 3 (Week 2): Patient will identify 2 physical and 2 cognitive deficits with Max A multimodal cues.  SLP Short Term Goal 4 (Week 2): Patient will peform pharyngeal strengthening exercises with Max A multimodal cues.  SLP Short Term Goal 5 (Week 2): Patient will consume trials of ice chips with minimal overt s/s of aspiration with Max A mutlimodal cues.  SLP Short Term Goal 6 (Week 2): Patient will utilize an increased vocal intensity at the phrase level with Max A multimodal cues.   Skilled Therapeutic Interventions: Skilled treatment session focused on dysphagia and cognitive goals. Upon arrival, patient was awake while sitting upright in the wheelchair. SLP facilitated session by providing supervision verbal cues for functional problem solving with use of the suction toothbrush during oral care. Patient consumed trials of ice chips and demonstrated a suspected delayed swallow initiation with intermittent use of multiple swallows and an intermittent throat clear/cough which was successful in expectorating minimal, clear secretions.  Towards end of trials, patient reported he was hot and demonstrated increased restlessness and confusion. Patient reported his daughter was present and that he needed to go to her air conditioned car to cool off and required total A multimodal cues for reorientation. Patient was transferred back to bed with TLSO removed in attempts to  decrease restlessness, however, patient continued to perseverative on removal of C-collar. [name] mitts were donned, however, patient consistently trying to remove them throughout the remainder of the session. RN made aware. Patient is scheduled for a FEES tomorrow to assess dysphagia. Continue with current plan of care.   FIM:  Comprehension Comprehension Mode: Auditory Comprehension: 3-Understands basic 50 - 74% of the time/requires cueing 25 - 50%  of the time Expression Expression Mode: Verbal Expression: 3-Expresses basic 50 - 74% of the time/requires cueing 25 - 50% of the time. Needs to repeat parts of sentences. Social Interaction Social Interaction: 4-Interacts appropriately 75 - 89% of the time - Needs redirection for appropriate language or to initiate interaction. Problem Solving Problem Solving: 3-Solves basic 50 - 74% of the time/requires cueing 25 - 49% of the time Memory Memory: 3-Recognizes or recalls 50 - 74% of the time/requires cueing 25 - 49% of the time  Pain Pain Assessment Pain Assessment: No/denies pain Pain Score: 0-No pain  Therapy/Group: Individual Therapy  Hunter Myers 07/20/2014, 3:23 PM

## 2014-07-21 ENCOUNTER — Inpatient Hospital Stay (HOSPITAL_COMMUNITY): Payer: Medicare Other | Admitting: Speech Pathology

## 2014-07-21 ENCOUNTER — Inpatient Hospital Stay (HOSPITAL_COMMUNITY): Payer: Medicare Other

## 2014-07-21 ENCOUNTER — Inpatient Hospital Stay (HOSPITAL_COMMUNITY): Payer: Medicare Other | Admitting: *Deleted

## 2014-07-21 MED ORDER — METHYLPHENIDATE HCL 5 MG PO TABS
5.0000 mg | ORAL_TABLET | Freq: Two times a day (BID) | ORAL | Status: DC
  Administered 2014-07-22 – 2014-08-04 (×27): 5 mg
  Filled 2014-07-21 (×27): qty 1

## 2014-07-21 NOTE — Progress Notes (Signed)
Physical Therapy Session Note  Patient Details  Name: Hunter MartesDwight Selsor MRN: [id number] Date of Birth: 07/15/1946  Today's Date: 07/21/2014 PT Individual Time: 1100-1200  1630-1650 PT Individual Time Calculation (min): 60 min and 20 min (missed 10 minutes due to xray)  Short Term Goals: Week 2:  PT Short Term Goal 1 (Week 2): Patient will perform bed mobility from flat surface without bedrails and minA. PT Short Term Goal 2 (Week 2): Patient will perform functional transfers with modA consistently. PT Short Term Goal 3 (Week 2): Patient will perform gait training x50' with LRAD and modA. PT Short Term Goal 4 (Week 2): Patient will negotiate 5 steps with B handrails and modA. PT Short Term Goal 5 (Week 2): Patient will demonstrate carry over within session with mobility techniques with mod cues.  Skilled Therapeutic Interventions/Progress Updates:    AM session: Pt was first encountered at nurse's station sitting up in w/c with elbow and soft waistbelt restraints in place.Session focused on functional mobility, NMR, and activity tolerance. Pt self-propelled w/c from nurse's station to rehab gym 75' using BUE and LLE with supervision and min verbal cues to maneuver around obstacles in the hallway and rehab gym. Pt went up and down 3 steps using step-to pattern, B handrails and mod A +2 for safety; descent performed backwards. Pt rested after stair activity while PT discussed future POC and implications for d/c with wife; pt's wife notified pt's LTGs changed to w/c-min A overall. Pt performed standing NMR activity at parallel bars to promote forward trunk lean in standing; pt performed weight shift to L and R while standing at parallel bars to promote weight acceptance and stability on RLE; standing activity performed with min A and mod verbal cues to promote weight shift, sustained RLE extension, and forward trunk lean. Pt was transported in w/c total assist back to room where pt was left sitting up in  w/c with all restraints in place and wife at bedside.   PM Session: Pt was found at nurse's station sitting up in w/c with all restraints in place. PT informed by RN that pt to be transported soon for scheduled xray; session focused on functional transfer and bed mobility. Pt was transported back to room in w/c total assist. Pt performed stand pivot transfer w/c>bed with mod A and verbal cues for sequencing and stepping. Pt performed bed mobility (sit>supine, rolling L and R), using handrails and min verbal cues for proper rolling technique. Pt was left supine in bed with all restraints in place and all needs within reach.   Therapy Documentation Precautions:  Precautions Precautions: Fall, Cervical, Back Precaution Comments: NPO with PEG Required Braces or Orthoses: Cervical Brace, Spinal Brace Cervical Brace: Hard collar Spinal Brace: Thoracolumbosacral orthotic, Applied in supine position Restrictions Weight Bearing Restrictions: No Pain: Pain Assessment Pain Assessment: No/denies pain Pain Score: 0-No pain Locomotion : Ambulation Ambulation/Gait Assistance: 3: Mod assist (Mod A 2+) Wheelchair Mobility Distance: 2675'   See FIM for current functional status  Therapy/Group: Individual Therapy  Fronie Holstein 07/21/2014, 12:08 PM

## 2014-07-21 NOTE — Plan of Care (Signed)
Problem: RH Ambulation Goal: LTG Patient will ambulate in controlled environment (PT) LTG: Patient will ambulate in a controlled environment, # of feet with assistance (PT).  goal downgraded 07/21/14 due to slow progress, poor safety awareness, impulsivity, poor postural control and poor balance. Goal: LTG Patient will ambulate in home environment (PT) LTG: Patient will ambulate in home environment, # of feet with assistance (PT).  Outcome: Not Applicable Date Met:  29/93/71 goal discharged11/17/15 due to slow progress, poor safety awareness, impulsivity, poor postural control and poor balance.  Problem: RH Stairs Goal: LTG Patient will ambulate up and down stairs w/assist (PT) LTG: Patient will ambulate up and down # of stairs with assistance (PT)  Goal modified 07/21/14.  Problem: RH Memory Goal: LTG Patient demonstrate ability for day to day recall (PT) LTG: Patient will demonstrate ability for day to day recall/carryover during mobility activities with assist (PT)  goal downgraded 07/21/14 due to slow progress and cognitive deficits.  Problem: RH Awareness Goal: LTG: Patient will demonstrate intellectual/emergent (PT) LTG: Patient will demonstrate intellectual/emergent/anticipatory awareness with assist during a mobility activity (PT)  goal downgraded 07/21/14 due to slow progress and cognitive deficits.

## 2014-07-21 NOTE — Progress Notes (Signed)
Physical Therapy Session Note  Patient Details  Name: Hunter MartesDwight Oland MRN: [id number] Date of Birth: 03/11/1946  Today's Date: 07/21/2014 PT Individual Time: 1300-1345 PT Individual Time Calculation (min): 45 min   Short Term Goals: Week 2:  PT Short Term Goal 1 (Week 2): Patient will perform bed mobility from flat surface without bedrails and minA. PT Short Term Goal 2 (Week 2): Patient will perform functional transfers with modA consistently. PT Short Term Goal 3 (Week 2): Patient will perform gait training x50' with LRAD and modA. PT Short Term Goal 4 (Week 2): Patient will negotiate 5 steps with B handrails and modA. PT Short Term Goal 5 (Week 2): Patient will demonstrate carry over within session with mobility techniques with mod cues.  Skilled Therapeutic Interventions/Progress Updates:  1:1. Pt received supine in bed with restraints in place. Co-tx this session with Rec therapist for focus on on precautions, safety during functional transfers, functional endurance during w/c propulsion as well as dynamic sitting/standing balance. Pt req min A for B rolling with use of bed rails for therapist to don TLSO, mod cues to maintain precautions as well as importance of various braces. Pt req supervision for t/f sup>sit EOB and min A for squat pivot t/f bed<>w/c. Pt req supervision for w/c propulsion 75'x1 and 50'x1 with B UE and L LE.   Pt engaged in dynamic reaching for horseshoes with B UE, maintaining 5 second hold before grasping and tossing to target postural control, pt req min A overall with cues for decreased speed due to impulsivity. Pt also engaged in horseshoe toss at standing level for emphasis on increased anterior lean with therapist facilitating hip flexion, req mod Ax1person overall.   Pt left sitting in w/c at nurses station at end of session with restraints in place.   Therapy Documentation Precautions:  Precautions Precautions: Fall, Cervical, Back Precaution Comments: NPO  with PEG Required Braces or Orthoses: Cervical Brace, Spinal Brace Cervical Brace: Hard collar Spinal Brace: Thoracolumbosacral orthotic, Applied in supine position Restrictions Weight Bearing Restrictions: No  See FIM for current functional status  Therapy/Group: Individual Therapy  [name], Kyley Solow S 07/21/2014, 3:17 PM
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
## 2014-07-21 NOTE — Progress Notes (Signed)
Recreational Therapy Session Note  Patient Details  Name: Dayton MartesDwight Hogate MRN: [id number] Date of Birth: 11/29/1945 Today's Date: 07/21/2014  Pain: no c/o Skilled Therapeutic Interventions/Progress Updates: Session focused on activity tolerance, w/c mobiltiy, dynamic sitting balance & dynamic standing balance, & cognition for simple math.  Pt performed w/c mobility on unit using BUE's with supervision, min cues for encouragement.  Pt performed squat pivot transfers with min assist & instructional cues for safety.  Pt sat EOM reaching for horseshoes outside BOS forward, to left, or to right with mod assist.  Progressed to standing reaching outside BOS for horseshoes with mod assist.  Pt required instructional cues for body positioning throughout activity.  Pt performed simple math adding up score with min cues.  Therapy/Group: Co-Treatment  Damiah Mcdonald 07/21/2014, 1:53 PM
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
## 2014-07-22 ENCOUNTER — Inpatient Hospital Stay (HOSPITAL_COMMUNITY): Payer: Medicare Other

## 2014-07-22 ENCOUNTER — Inpatient Hospital Stay (HOSPITAL_COMMUNITY): Payer: Medicare Other | Admitting: Speech Pathology

## 2014-07-22 ENCOUNTER — Inpatient Hospital Stay (HOSPITAL_COMMUNITY): Payer: Medicare Other | Admitting: Physical Therapy

## 2014-07-22 ENCOUNTER — Inpatient Hospital Stay (HOSPITAL_COMMUNITY): Payer: Medicare Other | Admitting: Occupational Therapy

## 2014-07-22 DIAGNOSIS — S069X1S Unspecified intracranial injury with loss of consciousness of 30 minutes or less, sequela: Secondary | ICD-10-CM

## 2014-07-22 NOTE — Progress Notes (Signed)
Speech Language Pathology Daily Session Note  Patient Details  Name: Hunter MartesDwight Myers MRN: [id number] Date of Birth: 10/24/1945  Today's Date: 07/22/2014 SLP Individual Time: 1300-1400 SLP Individual Time Calculation (min): 60 min  Short Term Goals: Week 2: SLP Short Term Goal 1 (Week 2): Patient will demonstrate sustained attention to a functional and familiar task for 5 minutes with Max A multimodal cues cues.  SLP Short Term Goal 2 (Week 2): Patient will orient to time, place and situation with Min A multimodal cues.  SLP Short Term Goal 3 (Week 2): Patient will identify 2 physical and 2 cognitive deficits with Max A multimodal cues.  SLP Short Term Goal 4 (Week 2): Patient will peform pharyngeal strengthening exercises with Max A multimodal cues.  SLP Short Term Goal 5 (Week 2): Patient will consume trials of ice chips with minimal overt s/s of aspiration with Max A mutlimodal cues.  SLP Short Term Goal 6 (Week 2): Patient will utilize an increased vocal intensity at the phrase level with Max A multimodal cues.   Skilled Therapeutic Interventions: Skilled treatment session focused on cognitive goals. Upon arrival, patient was asleep while supine in bed with visitors present.  Patient required max encouragement for arousal and to focus attention to clinician for ~30 seconds.  Patient was then transferred to the wheelchair to increase alertness and arousal and overall participation in therapy session.  Patient continued to demonstrate fatigue and lethargy throughout the session and required Mod multimodal cues for attention to functional tasks and supervision multimodal cues for functional problem solving with using a suction toothbrush and for cessation of task. Patient demonstrated increased intellectual awareness of cognitive deficits and became tearful throughout a functional conversation in regards to the impact his deficits have on his overall function.  [name] elbow restraints were removed  throughout the session and patient did not attempt to pull at his C-collar with Mod I.  Trials were not attempted today due to fatigue. Patient handed off to PT. Continue with current plan of care.    FIM:  Comprehension Comprehension Mode: Auditory Comprehension: 3-Understands basic 50 - 74% of the time/requires cueing 25 - 50%  of the time Expression Expression Mode: Verbal Expression: 3-Expresses basic 50 - 74% of the time/requires cueing 25 - 50% of the time. Needs to repeat parts of sentences. Social Interaction Social Interaction: 4-Interacts appropriately 75 - 89% of the time - Needs redirection for appropriate language or to initiate interaction. Problem Solving Problem Solving: 3-Solves basic 50 - 74% of the time/requires cueing 25 - 49% of the time Memory Memory: 3-Recognizes or recalls 50 - 74% of the time/requires cueing 25 - 49% of the time  Pain Reported pain in back, RN made aware and patient was premedicated   Therapy/Group: Individual Therapy  Hunter Myers 07/22/2014, 3:13 PM
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
## 2014-07-22 NOTE — Progress Notes (Signed)
Physical Therapy Session Note  Patient Details  Name: Hunter MartesDwight Myers MRN: [id number] Date of Birth: 10/26/1945  Today's Date: 07/22/2014 PT Individual Time: [id number] PT Individual Time Calculation (min): 75 min   Short Term Goals: Week 2:  PT Short Term Goal 1 (Week 2): Patient will perform bed mobility from flat surface without bedrails and minA. PT Short Term Goal 2 (Week 2): Patient will perform functional transfers with modA consistently. PT Short Term Goal 3 (Week 2): Patient will perform gait training x50' with LRAD and modA. PT Short Term Goal 4 (Week 2): Patient will negotiate 5 steps with B handrails and modA. PT Short Term Goal 5 (Week 2): Patient will demonstrate carry over within session with mobility techniques with mod cues.  Skilled Therapeutic Interventions/Progress Updates:    Pt was found in room supine in bed with soft waistbelt and elbow splints in place. Session focused on functional transfers and mobility, NMR, gait training,and activity tolerance. Pt performed bed mobility (rolling L and R, sit<>supine) to change brief and don back brace with supervision and use of bedrails. Pt self-propelled w/c room<>rehab gym 150' x2 using BUE and LLE with supervision and min verbal cues for technique. Pt ambulated 7485' with mod A +2 persons with verbal cues and manual facilitation for increased BOS and longer step length on R. Pt with very poor awareness of narrow base of support. Pt performed NMR activity to promote wider stance with gait by side stepping in hospital hallway to R and L while holding onto wall handrail and min A, verbal cues given for foot placement, position, and magnitude of stepping. Pt negotiated 4 steps using step-to pattern B handrails and mod A, with verbal cues for sequencing and foot position; performed backwards upon descent. Pt performed NMR to promote RLE hip and knee flexion and abduction by side stepping RLE onto first step, verbal cues provided to coordinate  movement and manual facilitation provided at pelvis to promote anterior trunk lean in standing. Emphasis on STS at parallel bars to  promote anterior trunk lean and RLE extension with min A; pt progressed to take small alternating steps while standing at parallel bars with min A, verbal cues provided for foot placement, weight shift, and posture. Pt required min A for t/f to bed at end of session, left in room semi-reclined in bed with all restraints in place and all needs in reach.   Therapy Documentation Precautions:  Precautions Precautions: Fall, Cervical, Back Precaution Comments: NPO with PEG Required Braces or Orthoses: Cervical Brace, Spinal Brace Cervical Brace: Hard collar Spinal Brace: Thoracolumbosacral orthotic, Applied in supine position Restrictions Weight Bearing Restrictions: No Locomotion : Ambulation Ambulation/Gait Assistance: 3: Mod assist;1: +2 Total assist (mod A +2) Wheelchair Mobility Distance: 150'   See FIM for current functional status  Therapy/Group: Individual Therapy  [name] 07/22/2014, 6:34 PM

## 2014-07-22 NOTE — Progress Notes (Signed)
Occupational Therapy Session Note  Patient Details  Name: Hunter MartesDwight Kington MRN: [id number] Date of Birth: 12/14/1945  Today's Date: 07/22/2014 OT Individual Time: [id number] OT Individual Time Calculation (min): 60 min    Short Term Goals: Week 2:  OT Short Term Goal 1 (Week 2): Pt will complete toilet task with max assist  OT Short Term Goal 2 (Week 2): Pt will demonstrate sustained attention to self-care tasks with min cues OT Short Term Goal 3 (Week 2): Pt will complete LB dressing with max assist OT Short Term Goal 4 (Week 2): Pt will complete toilet transfer with min assist   Skilled Therapeutic Interventions/Progress Updates:    Pt seen for ADL retraining with focus on sitting/standing balance, cognitive remediation, functional mobility, and safety awareness. Pt received supine in bed oriented x3 with min questioning cues. Pt verbalized 3 impairments at this time with increased time. Completed peri hygiene and donning of brief while supine in bed. Pt sat EOB with mod-SBA for sitting balance while engaging in UB bathing and dressing. Pt problem solved to correct shirt as it was inside out with increased time and min cues. Ambulated ~12 feet to w/c with min assist +2 3 musketeers technique. Completed LB dressing from w/c level with pt requiring mod cues and increased time for use of sock-aid. Pt completed sit<>stand at sink with min-max assist for balance as pt assisted with managing clothing around waist. Pt stood to complete oral care with mod assist overall. Pt with improved sustained attention to self-care tasks, requiring min cues as pt repeating asking for a drink 3x after education provided on NPO. Pt left sitting in w/c at nurses station.   Therapy Documentation Precautions:  Precautions Precautions: Fall, Cervical, Back Precaution Comments: NPO with PEG Required Braces or Orthoses: Cervical Brace, Spinal Brace Cervical Brace: Hard collar Spinal Brace: Thoracolumbosacral orthotic,  Applied in supine position Restrictions Weight Bearing Restrictions: No General:   Vital Signs: Therapy Vitals Temp: 98.1 F (36.7 C) Temp Source: Oral Pulse Rate: 71 Resp: 17 BP: 124/80 mmHg Patient Position (if appropriate): Lying Oxygen Therapy SpO2: 100 % O2 Device: Not Delivered Pain:  No report of pain during therapy session.  See FIM for current functional status  Therapy/Group: Individual Therapy  [name], Lyana Asbill N 07/22/2014, 7:55 AM

## 2014-07-22 NOTE — Plan of Care (Signed)
Problem: RH BOWEL ELIMINATION Goal: RH STG MANAGE BOWEL WITH ASSISTANCE STG Manage Bowel with max Assistance.  Outcome: Progressing Goal: RH STG MANAGE BOWEL W/MEDICATION W/ASSISTANCE STG Manage Bowel with Medication with max Assistance.  Outcome: Progressing  Problem: RH BLADDER ELIMINATION Goal: RH STG MANAGE BLADDER WITH ASSISTANCE STG Manage Bladder With max Assistance  Outcome: Progressing  Problem: RH SKIN INTEGRITY Goal: RH STG SKIN FREE OF INFECTION/BREAKDOWN Remain free form skin breakdown while on Rehab with max assist  Outcome: Progressing Goal: RH STG MAINTAIN SKIN INTEGRITY WITH ASSISTANCE STG Maintain Skin Integrity With mod Assistance.  Outcome: Progressing Goal: RH STG ABLE TO PERFORM INCISION/WOUND CARE W/ASSISTANCE STG Able To Perform Incision/Wound Care With total Assistance.  Outcome: Progressing  Problem: RH SAFETY Goal: RH STG ADHERE TO SAFETY PRECAUTIONS W/ASSISTANCE/DEVICE STG Adhere to Safety Precautions With max Assistance/Device.  Outcome: Progressing Goal: RH STG DECREASED RISK OF FALL WITH ASSISTANCE STG Decreased Risk of Fall With max Assistance.  Outcome: Progressing  Problem: RH PAIN MANAGEMENT Goal: RH STG PAIN MANAGED AT OR BELOW PT'S PAIN GOAL Pain level less than 3  Outcome: Progressing
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
## 2014-07-22 NOTE — Progress Notes (Signed)
Physical Therapy Session Note  Patient Details  Name: Hunter Myers MRN: [id number] Date of Birth: 07/08/1946  Today's Date: 07/22/2014 PT Individual Time: 1401-1435 PT Individual Time Calculation (min): 34 min   Short Term Goals: Week 2:  PT Short Term Goal 1 (Week 2): Patient will perform bed mobility from flat surface without bedrails and minA. PT Short Term Goal 2 (Week 2): Patient will perform functional transfers with modA consistently. PT Short Term Goal 3 (Week 2): Patient will perform gait training x50' with LRAD and modA. PT Short Term Goal 4 (Week 2): Patient will negotiate 5 steps with B handrails and modA. PT Short Term Goal 5 (Week 2): Patient will demonstrate carry over within session with mobility techniques with mod cues.  Skilled Therapeutic Interventions/Progress Updates:   Pt received seated in w/c in treatment gym having just completed session with SLP; agreeable to therapy. Pt wearing cervical collar, TLSO, and R AFO. Pt performed static standing with bilat UE support then single UE support at parallel bars with verbal cueing for postural awareness due to posterior preference, tactile cueing for increased RLE weightbearing. Pt required min-mod A for postural stability during static standing without UE support. Transitioned to blocked practice of short-distance gait with bilat UE's at parallel bars with manual facilitation of lateral weight shift to R side. Transported pt to room in w/c with total A due to pt expressing urgent need to urinate. Returned to nurses' station via w/c mobility x25' in controlled environment with bilat UE's and LLE and supervision. Pt left seated at nurses' station with bilat UE flexion block splints on and soft belt in place for safety and pt in no apparent distress.   Therapy Documentation Precautions:  Precautions Precautions: Fall, Cervical, Back Precaution Comments: NPO with PEG Required Braces or Orthoses: Cervical Brace, Spinal  Brace Cervical Brace: Hard collar Spinal Brace: Thoracolumbosacral orthotic, Applied in supine position Restrictions Weight Bearing Restrictions: No Vital Signs: Therapy Vitals Temp: 97.9 F (36.6 C) Temp Source: Oral Pulse Rate: 96 Resp: 18 BP: 114/82 mmHg Patient Position (if appropriate): Sitting Oxygen Therapy SpO2: 100 % O2 Device: Not Delivered Pain: Pain Assessment Pain Assessment: No/denies pain Locomotion : Ambulation Ambulation/Gait Assistance: 1: +2 Total assist;3: Mod assist (+2A for w/c follow) Wheelchair Mobility Distance: 40   See FIM for current functional status  Therapy/Group: Individual Therapy  Hobble, Lorenda IshiharaBlair A 07/22/2014, 4:25 PM
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
## 2014-07-22 NOTE — Progress Notes (Signed)
Physical Therapy Session Note  Patient Details  Name: Hunter MartesDwight Myers MRN: [id number] Date of Birth: 03/07/1946  Today's Date: 07/22/2014 PT Individual Time:  1015-1100 PT Individual Time Calculation (min): 45 min   Short Term Goals: Week 2:  PT Short Term Goal 1 (Week 2): Patient will perform bed mobility from flat surface without bedrails and minA. PT Short Term Goal 2 (Week 2): Patient will perform functional transfers with modA consistently. PT Short Term Goal 3 (Week 2): Patient will perform gait training x50' with LRAD and modA. PT Short Term Goal 4 (Week 2): Patient will negotiate 5 steps with B handrails and modA. PT Short Term Goal 5 (Week 2): Patient will demonstrate carry over within session with mobility techniques with mod cues.  Skilled Therapeutic Interventions/Progress Updates:  1:1. Pt received sitting in w/c at nurses station ready for therapy. Pt reporting pain in back, RN present to provide pain medicine. Focus this session on functional endurance during w/c propulsion, safety during functional transfers and NMR in sitting. Pt req supervision for w/c propulsion with B UE/L LE 75'x1 and 100'x1 with min cues for technique and obstacle negotiation. Pt req min A for squat pivot t/f w/c<>tx mat with min cues for hand placement. Pt practiced t/f sup<>sit on tx mat using log roll technique for emphasis on following back precautions without use of bed rails. Pt initially req min guard-min A, progressing to close(S) with repetition.   Pt completed 2 complex pipe tree diagrams while sitting EOM on red air disc and without B LE support to challenge overall postural control. Pt req min guard-min A to maintain sitting balance with intermittent UE support. Pt req min cues for problem solving.   Pt left sitting in w/c at nurses station, soft waist belt and elbow splints in place.   Therapy Documentation Precautions:  Precautions Precautions: Fall, Cervical, Back Precaution Comments: NPO  with PEG Required Braces or Orthoses: Cervical Brace, Spinal Brace Cervical Brace: Hard collar Spinal Brace: Thoracolumbosacral orthotic, Applied in supine position Restrictions Weight Bearing Restrictions: No  See FIM for current functional status  Therapy/Group: Individual Therapy  [name], [name] 07/22/2014, 4:52 PM

## 2014-07-23 ENCOUNTER — Inpatient Hospital Stay (HOSPITAL_COMMUNITY): Payer: Medicare Other | Admitting: Speech Pathology

## 2014-07-23 ENCOUNTER — Encounter (HOSPITAL_COMMUNITY): Payer: Medicare Other

## 2014-07-23 ENCOUNTER — Inpatient Hospital Stay (HOSPITAL_COMMUNITY): Payer: Medicare Other | Admitting: Physical Therapy

## 2014-07-23 ENCOUNTER — Inpatient Hospital Stay (HOSPITAL_COMMUNITY): Payer: Medicare Other

## 2014-07-23 DIAGNOSIS — S069X4A Unspecified intracranial injury with loss of consciousness of 6 hours to 24 hours, initial encounter: Secondary | ICD-10-CM

## 2014-07-23 LAB — GLUCOSE, CAPILLARY
Glucose-Capillary: 86 mg/dL (ref 70–99)
Glucose-Capillary: 117 mg/dL — ABNORMAL HIGH (ref 70–99)

## 2014-07-23 MED ORDER — CALCIUM CARBONATE 1250 MG/5ML PO SUSP
500.0000 mg | Freq: Two times a day (BID) | ORAL | Status: DC
  Administered 2014-07-23 – 2014-08-06 (×29): 500 mg
  Filled 2014-07-23 (×33): qty 5

## 2014-07-23 MED ORDER — JEVITY 1.2 CAL PO LIQD: 1000.0000 mL | ORAL | Status: DC

## 2014-07-23 NOTE — Progress Notes (Addendum)
Speech Language Pathology Daily Session Note  Patient Details  Name: Hunter Myers: [id number] Date of Birth: 10/28/1945  Today's Date: 07/23/2014 SLP Individual Time: [id number] SLP Individual Time Calculation (min): 43 min  Short Term Goals: Week 2: SLP Short Term Goal 1 (Week 2): Patient will demonstrate sustained attention to a functional and familiar task for 5 minutes with Max A multimodal cues cues.  SLP Short Term Goal 2 (Week 2): Patient will orient to time, place and situation with Min A multimodal cues.  SLP Short Term Goal 3 (Week 2): Patient will identify 2 physical and 2 cognitive deficits with Max A multimodal cues.  SLP Short Term Goal 4 (Week 2): Patient will peform pharyngeal strengthening exercises with Max A multimodal cues.  SLP Short Term Goal 5 (Week 2): Patient will consume trials of ice chips with minimal overt s/s of aspiration with Max A mutlimodal cues.  SLP Short Term Goal 6 (Week 2): Patient will utilize an increased vocal intensity at the phrase level with Max A multimodal cues.   Skilled Therapeutic Interventions: Skilled treatment session focused on addressing dysphagia goals. Patient consumed trials of Dys. 1 textures and nectar-thick liquids via teaspoon without overt s/s of aspiration but required Mod faded to Min assist multimodal cues for utilization of multiple swallows, alternating solids/liquids and utilization of small bites/sips.  Patient completed cued throat clear X 1 which effectively cleared wet vocal quality. Patient was able to participate in therapy for 43 minutes and then agitation about leaving to go home increased; as a result, session ended with patient in bed with restraints in place 17 minutes early. Continue with current plan of care.     FIM:  Comprehension Comprehension Mode: Auditory Comprehension: 3-Understands basic 50 - 74% of the time/requires cueing 25 - 50%  of the time Expression Expression Mode: Verbal Expression:  4-Expresses basic 75 - 89% of the time/requires cueing 10 - 24% of the time. Needs helper to occlude trach/needs to repeat words. Social Interaction Social Interaction: 5-Interacts appropriately 90% of the time - Needs monitoring or encouragement for participation or interaction. Problem Solving Problem Solving: 3-Solves basic 50 - 74% of the time/requires cueing 25 - 49% of the time Memory Memory: 2-Recognizes or recalls 25 - 49% of the time/requires cueing 51 - 75% of the time FIM - Eating Eating Activity: 5: Supervision/cues (with trials from SLP)  Pain Pain Assessment Pain Assessment: No/denies pain  Therapy/Group: Individual Therapy  Charlane FerrettiMelissa Poseidon Pam, M.A., CCC-SLP [phone]  [name] 07/23/2014, 4:03 PM

## 2014-07-23 NOTE — Progress Notes (Signed)
Late note: Yesterday family had questions regarding X rays as well as NS consult here. Discussed results with wife and daughter as well as appointment for follow up with Dr. Christain SacramentoBirkedal. Also that we do not have any films for comparison to fully determine healing and NS here will not be able to assist with recommendations. Gave daughter disc with X rays to drop off at ortho office. Daughter with concerns about need for restraints and their ability to provide supervision--wife will be the only one at home during the day. Recommended family come up with plan to assist their mother at home to help reinforce need to keep brace on/safety.

## 2014-07-23 NOTE — Patient Care Conference (Signed)
Inpatient RehabilitationTeam Conference and Plan of Care Update Date: 07/21/2014   Time: 3:05 PM    Patient Name: Hunter MartesDwight Myers      Medical Record Number: [id number]  Date of Birth: 12/18/1945 Sex: Male         Room/Bed: 4W13C/4W13C-01 Payor Info: Payor: MEDICARE / Plan: MEDICARE PART A AND B / Product Type: *No Product type* /    Admitting Diagnosis: major mult trauma  [facility]  WITH RESP FAILURE TRACH   Admit Date/Time:  07/09/2014  3:47 PM Admission Comments: No comment available   Primary Diagnosis:  TBI (traumatic brain injury) Principal Problem: TBI (traumatic brain injury)  Patient Active Problem List   Diagnosis Date Noted  . Multiple fractures of cervical spine 07/09/2014  . Dysphagia, pharyngoesophageal phase   . Severe malnutrition   . Acute respiratory failure 06/09/2014  . TBI (traumatic brain injury) 06/09/2014  . HCAP (healthcare-associated pneumonia) 06/09/2014  . C5 vertebral fracture 06/09/2014  . Acute on chronic respiratory failure 06/09/2014  . Tracheostomy status 06/09/2014    Expected Discharge Date: Expected Discharge Date: 08/01/14  Team Members Present: Physician leading conference: Dr. Faith RogueZachary Swartz Social Worker Present: Amada JupiterLucy Terrilynn Postell, LCSW Nurse Present: Kennon PortelaJeanna Hicks, RN PT Present: Cyndia SkeetersBridgett Ripa, Scot JunPT;Caroline King, PT OT Present: Ardis Rowanom Lanier, COTA;Jennifer Fredrich RomansSmith, OT;Kayla Perkinson, OT SLP Present: Feliberto Gottronourtney Payne, SLP PPS Coordinator present : Tora DuckMarie Noel, RN, CRRN     Current Status/Progress Goal Weekly Team Focus  Medical   slow progress. still in collar and tlso. new xrays today. remains npo  improve focus, attention  swallowing trials, ?stimulant   Bowel/Bladder   Continent of bladder with max assist with urinal; incontinent of stool at times; LBM 11/15  Mod I for bowel and bladder  Offer toilet q2-3 hr and PRN   Swallow/Nutrition/ Hydration   NPO with PEG  Min A with least restrictive diet  Trials of Dys. 1 textures and  nectar-thick liquids via tsp    ADL's   mod assist transfers, mod-total assists self-care tasks   min assist overall and supervision UB dressing   transfers, postural control in sitting and standing, balance, cognitive remediation, safety awareness, education, and activity tolerance   Mobility   min-modA transfers, modAx2 for ambulation and stairs  min A wheelchair level  cognitive remediation, activity tolerance, safety, education, postural control, balance, functional mobility   Communication   Dysphonic and is ~90% intellgibile   Supervision at sentence level   utilization of increased vocal intensity    Safety/Cognition/ Behavioral Observations  Max-Total A   Mod A  orientation, attention, awarenes, problem solving    Pain   Constant pain in neck with lidocaine patch in use and oxycodone 5mg  scheduled BID and oxycodone 5-10 mg q4hr PRN  </=3  Offer pain medication prior to therapy and PRN; monitor for nonverbal signs of pain   Skin   Dry/flaky skin on bilateral heels with eucerin in use; blanchable redness to sacrum with allevyn and air mattress; healing trach site  No new skin breakdown  Monitor skin qshift; turn q2hr in bed and q1hr in chair      *See Care Plan and progress notes for long and short-term goals.  Barriers to Discharge: cognition, safety    Possible Resolutions to Barriers:  see prior, NMR    Discharge Planning/Teaching Needs:  home with wife and daughter to provide 24/7 vs possible change to SNF      Team Discussion:  Confirmed pt has several more weeks  to wear collar.  FEES today with some improvement but still NPO except with ST.  Most goals downgraded to min assist at w/c level.  Concern that wife will not be able to manage him at home - SW to follow up.  Revisions to Treatment Plan:  Many goals downgraded.  Possible change of d/c plan to SNF   Continued Need for Acute Rehabilitation Level of Care: The patient requires daily medical management by a  physician with specialized training in physical medicine and rehabilitation for the following conditions: Daily direction of a multidisciplinary physical rehabilitation program to ensure safe treatment while eliciting the highest outcome that is of practical value to the patient.: Yes Daily medical management of patient stability for increased activity during participation in an intensive rehabilitation regime.: Yes Daily analysis of laboratory values and/or radiology reports with any subsequent need for medication adjustment of medical intervention for : Pulmonary problems;Post surgical problems;Neurological problems  Lothar Prehn 07/23/2014, 2:05 PM

## 2014-07-23 NOTE — Progress Notes (Signed)
Recreational Therapy Session Note  Patient Details  Name: Dayton MartesDwight Geist MRN: [id number] Date of Birth: 05/02/1946 Today's Date: 07/23/2014  Pain: no c/o Skilled Therapeutic Interventions/Progress Updates: Co-treat with OT with focus on activity tolerance, overall safety,squat pivot tranfers, toileting, standing balance & oral care.  Pt performed stand pivot transfers with mod assist & squat pivot transfers with min assist.  Pt maintained standing balance for clothing management with mod assist.  Pt required min cues throughout standing & transfers for technique & safety.  Pt required max cues for problem solving to wash hands after toileting.  Pt completed oral care seated EOB with supervision & set up assist.  Therapy/Group: Co-Treatment  Janellie Tennison 07/23/2014, 4:30 PM

## 2014-07-23 NOTE — Progress Notes (Signed)
Social Work Patient ID: Hunter Myers, male   DOB: 07/28/1946, 68 y.o.   MRN: [id number]   Hunter JupiterLucy Xariah Silvernail, LCSW Social Worker Signed  Patient Care Conference 07/23/2014  2:05 PM    Expand All Collapse All   Inpatient RehabilitationTeam Conference and Plan of Care Update Date: 07/21/2014   Time: 3:05 PM     Patient Name: Hunter Myers       Medical Record Number: [id number]  Date of Birth: 11/08/1945 Sex: Male         Room/Bed: 4W13C/4W13C-01 Payor Info: Payor: MEDICARE / Plan: MEDICARE PART A AND B / Product Type: *No Product type* /    Admitting Diagnosis: major mult trauma  [facility]  WITH RESP FAILURE TRACH   Admit Date/Time:  07/09/2014  3:47 PM Admission Comments: No comment available   Primary Diagnosis:  TBI (traumatic brain injury) Principal Problem: TBI (traumatic brain injury)    Patient Active Problem List     Diagnosis  Date Noted   .  Multiple fractures of cervical spine  07/09/2014   .  Dysphagia, pharyngoesophageal phase     .  Severe malnutrition     .  Acute respiratory failure  06/09/2014   .  TBI (traumatic brain injury)  06/09/2014   .  HCAP (healthcare-associated pneumonia)  06/09/2014   .  C5 vertebral fracture  06/09/2014   .  Acute on chronic respiratory failure  06/09/2014   .  Tracheostomy status  06/09/2014     Expected Discharge Date: Expected Discharge Date: 08/01/14  Team Members Present: Physician leading conference: Dr. Faith RogueZachary Swartz Social Worker Present: Hunter JupiterLucy Vincenzina Jagoda, LCSW Nurse Present: Kennon PortelaJeanna Hicks, RN PT Present: Cyndia SkeetersBridgett Ripa, Scot JunPT;Caroline King, PT OT Present: Ardis Rowanom Lanier, COTA;Jennifer Fredrich RomansSmith, OT;Kayla Perkinson, OT SLP Present: Feliberto Gottronourtney Payne, SLP PPS Coordinator present : Tora DuckMarie Noel, RN, CRRN        Current Status/Progress  Goal  Weekly Team Focus   Medical     slow progress. still in collar and tlso. new xrays today. remains npo  improve focus, attention  swallowing trials, ?stimulant   Bowel/Bladder     Continent of bladder  with max assist with urinal; incontinent of stool at times; LBM 11/15  Mod I for bowel and bladder  Offer toilet q2-3 hr and PRN   Swallow/Nutrition/ Hydration     NPO with PEG  Min A with least restrictive diet  Trials of Dys. 1 textures and nectar-thick liquids via tsp    ADL's     mod assist transfers, mod-total assists self-care tasks    min assist overall and supervision UB dressing   transfers, postural control in sitting and standing, balance, cognitive remediation, safety awareness, education, and activity tolerance    Mobility     min-modA transfers, modAx2 for ambulation and stairs   min A wheelchair level  cognitive remediation, activity tolerance, safety, education, postural control, balance, functional mobility   Communication     Dysphonic and is ~90% intellgibile    Supervision at sentence level   utilization of increased vocal intensity    Safety/Cognition/ Behavioral Observations    Max-Total A   Mod A  orientation, attention, awarenes, problem solving    Pain     Constant pain in neck with lidocaine patch in use and oxycodone 5mg  scheduled BID and oxycodone 5-10 mg q4hr PRN  </=3  Offer pain medication prior to therapy and PRN; monitor for nonverbal signs of pain   Skin  Dry/flaky skin on bilateral heels with eucerin in use; blanchable redness to sacrum with allevyn and air mattress; healing trach site  No new skin breakdown  Monitor skin qshift; turn q2hr in bed and q1hr in chair       *See Care Plan and progress notes for long and short-term goals.    Barriers to Discharge:  cognition, safety     Possible Resolutions to Barriers:   see prior, NMR     Discharge Planning/Teaching Needs:   home with wife and daughter to provide 24/7 vs possible change to SNF       Team Discussion:    Confirmed pt has several more weeks to wear collar.  FEES today with some improvement but still NPO except with ST.  Most goals downgraded to min assist at w/c level.  Concern that  wife will not be able to manage him at home - SW to follow up.   Revisions to Treatment Plan:    Many goals downgraded.  Possible change of d/c plan to SNF    Continued Need for Acute Rehabilitation Level of Care: The patient requires daily medical management by a physician with specialized training in physical medicine and rehabilitation for the following conditions: Daily direction of a multidisciplinary physical rehabilitation program to ensure safe treatment while eliciting the highest outcome that is of practical value to the patient.: Yes Daily medical management of patient stability for increased activity during participation in an intensive rehabilitation regime.: Yes Daily analysis of laboratory values and/or radiology reports with any subsequent need for medication adjustment of medical intervention for : Pulmonary problems;Post surgical problems;Neurological problems  Antonina Deziel 07/23/2014, 2:05 PM                 Hunter Jupiter, LCSW Social Worker Signed  Patient Care Conference 07/14/2014  6:07 PM    Expand All Collapse All   Inpatient RehabilitationTeam Conference and Plan of Care Update Date: 07/14/2014   Time: 3:20 PM     Patient Name: Hunter Myers       Medical Record Number: [id number]  Date of Birth: August 29, 1946 Sex: Male         Room/Bed: 4W13C/4W13C-01 Payor Info: Payor: MEDICARE / Plan: MEDICARE PART A AND B / Product Type: *No Product type* /    Admitting Diagnosis: major mult trauma  [facility]  WITH RESP FAILURE TRACH   Admit Date/Time:  07/09/2014  3:47 PM Admission Comments: No comment available   Primary Diagnosis:  TBI (traumatic brain injury) Principal Problem: TBI (traumatic brain injury)    Patient Active Problem List     Diagnosis  Date Noted   .  Multiple fractures of cervical spine  07/09/2014   .  Dysphagia, pharyngoesophageal phase     .  Severe malnutrition     .  Acute respiratory failure  06/09/2014   .  TBI (traumatic brain injury)   06/09/2014   .  HCAP (healthcare-associated pneumonia)  06/09/2014   .  C5 vertebral fracture  06/09/2014   .  Acute on chronic respiratory failure  06/09/2014   .  Tracheostomy status  06/09/2014     Expected Discharge Date: Expected Discharge Date: 08/01/14  Team Members Present: Physician leading conference: Dr. Faith Rogue Social Worker Present: Hunter Jupiter, LCSW Nurse Present: Carlean Purl, RN PT Present: Zerita Boers, PT OT Present: Ardis Rowan, COTA;Jennifer Fredrich Romans, OT SLP Present: Feliberto Gottron, SLP PPS Coordinator present : Tora Duck, RN, CRRN  Current Status/Progress  Goal  Weekly Team Focus   Medical     hx of tbi/neck trauma, respiratory failure. trach out  sleep/behavior restoration  neck/trach removal. ?collar liberation    Bowel/Bladder     Incontinent of bowel, loose bowel movements at times, LBM 11/9; incontinent of urine at times with condom catheter in use  Manage bowel and bladder with briefs and max assist   Offer toilet q2-3 hours and PRN   Swallow/Nutrition/ Hydration     NPO with PEG  Min A with least restrictive diet  Trials of ice chips, pharyngeal strengthening exercises    ADL's     max-total assist overall   min assist overall and supervision UB dressing   transfers, sitting and standing balance, cognitive remediation, postural control, safety awarenss, activity tolerance    Mobility     Max-Ax2 persons overall  Supevision w/c level and cognition; Min A standing level   cognitive remediation, postural control, safety awareness, activity tolerance, transfers, balance, ambulation, w/c propulsion    Communication     Patient has dysphonia but is ~90% intelligible  No Goals set at this time  N/A   Safety/Cognition/ Behavioral Observations    Max-total A   Min A  orientation, attention intellectual awareness    Pain     Complains of pain in neck and BLE managed with oxycodone 5 mg scheduled BID and oxycodone 5-10 mg q4hr  PRN   </=3  Offer pain medication prior to therapy and PRN   Skin     Dry/flaky skin on bilateral heels with eucerin in use; blanchable redness to sacrum with allevyn and air mattress in use; healing trach site    No new skin breakdown  Turn patient q2hr in bed and boost q1hr in wheelchair     Rehab Goals Patient on target to meet rehab goals: Yes *See Care Plan and progress notes for long and short-term goals.    Barriers to Discharge:  cognition/safety     Possible Resolutions to Barriers:   supervision, cognitive remediation,routine     Discharge Planning/Teaching Needs:   home with wife and daughtet to provide 24/7 assistance        Team Discussion:    Janina Mayorach out and MD checking if we might be able to d/c collar.  Need to see if TLSO brace and be shaved down.  Supervision/ min goals overall.  Still NPO with peg.  Cognition slowly improving/.   Revisions to Treatment Plan:    None    Continued Need for Acute Rehabilitation Level of Care: The patient requires daily medical management by a physician with specialized training in physical medicine and rehabilitation for the following conditions: Daily direction of a multidisciplinary physical rehabilitation program to ensure safe treatment while eliciting the highest outcome that is of practical value to the patient.: Yes Daily medical management of patient stability for increased activity during participation in an intensive rehabilitation regime.: Yes Daily analysis of laboratory values and/or radiology reports with any subsequent need for medication adjustment of medical intervention for : Neurological problems;Post surgical problems;Pulmonary problems  Haruo Stepanek 07/14/2014, 6:07 PM

## 2014-07-23 NOTE — Plan of Care (Signed)
Problem: RH BOWEL ELIMINATION Goal: RH STG MANAGE BOWEL WITH ASSISTANCE STG Manage Bowel with max Assistance.  Outcome: Progressing Goal: RH STG MANAGE BOWEL W/MEDICATION W/ASSISTANCE STG Manage Bowel with Medication with max Assistance.  Outcome: Progressing  Problem: RH BLADDER ELIMINATION Goal: RH STG MANAGE BLADDER WITH ASSISTANCE STG Manage Bladder With max Assistance  Outcome: Progressing  Problem: RH SKIN INTEGRITY Goal: RH STG SKIN FREE OF INFECTION/BREAKDOWN Remain free form skin breakdown while on Rehab with max assist  Outcome: Progressing Goal: RH STG MAINTAIN SKIN INTEGRITY WITH ASSISTANCE STG Maintain Skin Integrity With mod Assistance.  Outcome: Progressing Goal: RH STG ABLE TO PERFORM INCISION/WOUND CARE W/ASSISTANCE STG Able To Perform Incision/Wound Care With total Assistance.  Outcome: Progressing  Problem: RH SAFETY Goal: RH STG ADHERE TO SAFETY PRECAUTIONS W/ASSISTANCE/DEVICE STG Adhere to Safety Precautions With max Assistance/Device.  Outcome: Progressing Goal: RH STG DECREASED RISK OF FALL WITH ASSISTANCE STG Decreased Risk of Fall With max Assistance.  Outcome: Progressing  Problem: RH PAIN MANAGEMENT Goal: RH STG PAIN MANAGED AT OR BELOW PT'S PAIN GOAL Pain level less than 3  Outcome: Progressing     

## 2014-07-23 NOTE — Progress Notes (Signed)
Occupational Therapy Session Note  Patient Details  Name: Hunter MartesDwight Myers MRN: [id number] Date of Birth: 09/19/1945  Today's Date: 07/23/2014 OT Individual Time: [id number] and [id number] OT Individual Time Calculation (min): 60 min and 30 min     Short Term Goals: Week 2:  OT Short Term Goal 1 (Week 2): Pt will complete toilet task with max assist  OT Short Term Goal 2 (Week 2): Pt will demonstrate sustained attention to self-care tasks with min cues OT Short Term Goal 3 (Week 2): Pt will complete LB dressing with max assist OT Short Term Goal 4 (Week 2): Pt will complete toilet transfer with min assist   Skilled Therapeutic Interventions/Progress Updates:    Session 1: Pt seen for ADL retraining with focus on cognitive remediation, use of AE, adherence to precautions, balance, and functional transfers. Pt received supine in bed oriented x3 with min cues. Pt initially perseverating on leaving hospital last night then reported "maybe it was a dream." Reoriented to unit and therapy stay thus far. Removed all restraints and pt with increased pain in B elbows and limited ROM. With time, pt progressed to full ROM and no pain. Pt completed peri hygiene and donning of brief via rolling L<>R as it is very difficult to manage brief up when wearing TLSO. Completed dressing and bathing sitting EOB at supervision level for balance. Utilized Sports administratorreacher with mod cues and increased time. Pt required mod assist for sit<>stand and standing balance to manage clothing around waist (pt assisting 75%). Completed squat pivot transfer bed<>w/c 2x at SBA level and min cues. Pt left sitting in w/c at nurses station.   Session 2: Therapy session focused on functional transfers, standing balance, sustained attention and activity tolerance. Pt received sitting chair in day room with wife and pt requesting to toilet. Pt completed stand pivot transfers w/c<>toilet with mod assist. Pt required mod assist standing balance for clothing  management down, then required assist to manage up. Pt required max cues for problem solving to wash hands following toilet task. Completed squat pivot transfer w/c>bed at supervision level and min cues. Pt completed oral care sitting unsupported EOB at supervision level. Pt then transferred back to chair with min assist and left sitting at nurses station. Pt with much brighter affect today as he engaged in more conversation with therapist and occasionally made jokes.   Therapy Documentation Precautions:  Precautions Precautions: Fall, Cervical, Back Precaution Comments: NPO with PEG Required Braces or Orthoses: Cervical Brace, Spinal Brace Cervical Brace: Hard collar Spinal Brace: Thoracolumbosacral orthotic, Applied in supine position Restrictions Weight Bearing Restrictions: No General:   Vital Signs:  Pain: Pt initially reporting pain with ROM at elbow upon doffing elbow immobilizers, however with activity he progressed to no pain.   See FIM for current functional status  Therapy/Group: Individual Therapy  [name], [name] 07/23/2014, 9:39 AM

## 2014-07-23 NOTE — Progress Notes (Signed)
Social Work Patient ID: Hunter Myers, male   DOB: [birth year], 68 y.o.   MRN: [id number]   Met with pt's wife this morning to review team conference and team concerns about pt's limited progress and his anticipated care needs at home.  Explained that most goals have been downgraded to min assist w/c level and concern that this is more than wife could manage at home.  She is agreed with these concerns and has now spoken with her daughter and both feel need to change d/c plan to SNF.  Plan to discuss with pt after wife has left today.  Wife prefers we conduct bed search closer to their home of [place], [place].  Will begin process.  Marvis Bakken, LCSW
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
## 2014-07-23 NOTE — Progress Notes (Signed)
Speech Language Pathology Daily Session Notes  Patient Details  Name: Hunter MartesDwight Myers MRN: [id number] Date of Birth: 05/07/1946  Today's Date: 07/23/2014  Session 1: SLP Individual Time: 1000-1100 SLP Individual Time Calculation (min): 60 min   Session 2: SLP Individual Time: 1400-1500 SLP Individual Time Calculation (min): 60 min  Short Term Goals: Week 2: SLP Short Term Goal 1 (Week 2): Patient will demonstrate sustained attention to a functional and familiar task for 5 minutes with Max A multimodal cues cues.  SLP Short Term Goal 2 (Week 2): Patient will orient to time, place and situation with Min A multimodal cues.  SLP Short Term Goal 3 (Week 2): Patient will identify 2 physical and 2 cognitive deficits with Max A multimodal cues.  SLP Short Term Goal 4 (Week 2): Patient will peform pharyngeal strengthening exercises with Max A multimodal cues.  SLP Short Term Goal 5 (Week 2): Patient will consume trials of ice chips with minimal overt s/s of aspiration with Max A mutlimodal cues.  SLP Short Term Goal 6 (Week 2): Patient will utilize an increased vocal intensity at the phrase level with Max A multimodal cues.   Skilled Therapeutic Interventions:  Session 1: Skilled treatment session focused on dysphagia goals and patient/family education. Upon arrival, patient was awake while sitting upright in the wheelchair with his wife present. SLP facilitated session by providing re-education to the patient's wife in regards to the patient's current swallowing function, goals of skilled SLP intervention and how this clinician does not anticipate the patient will be ready for a repeat objective swallow study prior to discharge. [name] wife verbalized understanding. Patient consumed trials of Dys. 1 textures and nectar-thick liquids via teaspoon without overt s/s of aspiration but required Mod  A multimodal cues for utilization of multiple swallows, alternating solids/liquids and utilization of small  bites/sips.  Patient also demonstrated cued throat clear X 2 which was productive in expectorating large amounts of mucous. Patient was left in the wheelchair with wife present and restraints in place. Continue with current plan of care.     Session 2: Skilled treatment session focused on cognitive goals. Upon arrival, patient was sitting upright in his wheelchair at the RN station and was agreeable to participate in treatment session. SLP facilitated session by providing extra time and Min A multimodal cues for orientation to time, place and situation and Mod A multimodal cues for intellectual awareness of his current cognitive and physical deficits and goals of therapy. Patient participated in a basic money management task and required Mod A multimodal cues for functional problem solving and to self-monitor and correct errors. Suspect function with task was impacted most by decreased working memory and impulsivity. Patient also participated in a  written expression task and required extra time and Min A multimodal cues to self-monitor and correct errors with basic biographical information.  Patient handed off to another clinician. Continue with current plan of care.   FIM:  Comprehension Comprehension Mode: Auditory Comprehension: 3-Understands basic 50 - 74% of the time/requires cueing 25 - 50%  of the time Expression Expression Mode: Verbal Expression: 4-Expresses basic 75 - 89% of the time/requires cueing 10 - 24% of the time. Needs helper to occlude trach/needs to repeat words. Social Interaction Social Interaction: 5-Interacts appropriately 90% of the time - Needs monitoring or encouragement for participation or interaction. Problem Solving Problem Solving: 3-Solves basic 50 - 74% of the time/requires cueing 25 - 49% of the time Memory Memory: 2-Recognizes or recalls 25 - 49%  of the time/requires cueing 51 - 75% of the time FIM - Eating Eating Activity: 5: Supervision/cues (with trials from  SLP)  Pain No/Denies Pain   Therapy/Group: Individual Therapy  Shyah Cadmus 07/23/2014, 3:42 PM  Feliberto Gottronourtney Delany Steury, MA, CCC-SLP [phone]

## 2014-07-24 ENCOUNTER — Inpatient Hospital Stay (HOSPITAL_COMMUNITY): Payer: Medicare Other

## 2014-07-24 ENCOUNTER — Inpatient Hospital Stay (HOSPITAL_COMMUNITY): Payer: Medicare Other | Admitting: Speech Pathology

## 2014-07-24 LAB — GLUCOSE, CAPILLARY
GLUCOSE-CAPILLARY: 149 mg/dL — AB (ref 70–99)
Glucose-Capillary: 96 mg/dL (ref 70–99)
Glucose-Capillary: 128 mg/dL — ABNORMAL HIGH (ref 70–99)
Glucose-Capillary: 138 mg/dL — ABNORMAL HIGH (ref 70–99)

## 2014-07-24 MED ORDER — QUETIAPINE FUMARATE 25 MG PO TABS
25.0000 mg | ORAL_TABLET | Freq: Every day | ORAL | Status: DC
  Administered 2014-07-24 – 2014-07-27 (×4): 25 mg
  Filled 2014-07-24 (×5): qty 1

## 2014-07-24 NOTE — Plan of Care (Signed)
Problem: RH BLADDER ELIMINATION Goal: RH STG MANAGE BLADDER WITH ASSISTANCE STG Manage Bladder With Min Assistance  Outcome: Progressing No incontinent episode this shift

## 2014-07-24 NOTE — Plan of Care (Signed)
Problem: RH SAFETY Goal: RH STG DECREASED RISK OF FALL WITH ASSISTANCE STG Decreased Risk of Fall With max Assistance.  Outcome: Progressing

## 2014-07-24 NOTE — Plan of Care (Signed)
Problem: RH Dressing Goal: LTG Patient will perform lower body dressing w/assist (OT) LTG: Patient will perform lower body dressing with assist, with/without cues in positioning using equipment (OT)  Downgraded to mod assist d/t slow progress in therapy  Problem: RH Toileting Goal: LTG Patient will perform toileting w/assist, cues/equip (OT) LTG: Patient will perform toiletiing (clothes management/hygiene) with assist, with/without cues using equipment (OT)  Downgraded to mod assist d/t slow progress in therapy

## 2014-07-24 NOTE — Plan of Care (Signed)
Problem: RH SAFETY Goal: RH STG ADHERE TO SAFETY PRECAUTIONS W/ASSISTANCE/DEVICE STG Adhere to Safety Precautions With max Assistance/Device.  Outcome: Progressing     

## 2014-07-24 NOTE — Plan of Care (Signed)
Problem: RH PAIN MANAGEMENT Goal: RH STG PAIN MANAGED AT OR BELOW PT'S PAIN GOAL Pain level less than 3  Outcome: Progressing No c/o pain     

## 2014-07-24 NOTE — Plan of Care (Signed)
Problem: RH Car Transfers Goal: LTG Patient will perform car transfers with assist (PT) LTG: Patient will perform car transfers with assistance (PT).  Outcome: Not Applicable Date Met:  65/46/50 Goal d/c pt plans to d/c to SNF

## 2014-07-24 NOTE — Progress Notes (Signed)
Occupational Therapy Weekly Progress Note  Patient Details  Name: Hunter Myers MRN: [id number] Date of Birth: 11-May-1946  Beginning of progress report period: July 17, 2014 End of progress report period: July 24, 2014  Today's Date: 07/24/2014 OT Individual Time: 5284-1324 and 1102-1208 OT Individual Time Calculation (min): 60 min and 66 min    Patient has met 3 of 4 short term goals.  Patient has progressed well during this reporting period. Patient currently requires min-mod assist squat pivot transfers with mod-max cues for safety and setup. Patient currently requires min-max assist for standing balance during self-care tasks due to posterior lean and decreased awareness. Patient requires min cues for sustained attention during self-care tasks and mod-max cues for sustained attention to functional activities. Patient greatest barriers at this time are decreased awareness, poor postural control in standing, and decreased attention.   Patient continues to demonstrate the following deficits: decreased strength, decreased insight into deficits, decreased safety awareness, poor postural control in sitting and standing, decreased standing balance, decreased coordination, decreased activity tolerance, decreased attention, decreased awareness, and overall impaired cognition and therefore will continue to benefit from skilled OT intervention to enhance overall performance with BADLs, awareness, balance, ability to compensate for deficits, attention, and strength.  Patient not progressing toward long term goals.  See goal revision. Lower body dressing and toileting goals downgraded to mod assist due to slow progress in therapy.  Plan of care revisions: Discharge plans changed to SNF.  OT Short Term Goals Week 2:  OT Short Term Goal 1 (Week 2): Pt will complete toilet task with max assist  OT Short Term Goal 1 - Progress (Week 2): Met OT Short Term Goal 2 (Week 2): Pt will demonstrate  sustained attention to self-care tasks with min cues OT Short Term Goal 2 - Progress (Week 2): Met OT Short Term Goal 3 (Week 2): Pt will complete LB dressing with max assist OT Short Term Goal 3 - Progress (Week 2): Met OT Short Term Goal 4 (Week 2): Pt will complete toilet transfer with min assist  OT Short Term Goal 4 - Progress (Week 2): Progressing toward goal Week 3:  OT Short Term Goal 1 (Week 3): Pt will complete toilet task with mod assist OT Short Term Goal 2 (Week 3): Pt will consistently complete toilet transfers at supervision level and min cues OT Short Term Goal 3 (Week 3): Pt will be oriented to place, time, and situation with supervision cues OT Short Term Goal 4 (Week 3): Pt will complete LB dressing with mod assist and min cues for use of AE  Skilled Therapeutic Interventions/Progress Updates:    Session 1: Pt seen for 1:1 OT session with focus on functional transfers, cognitive remediation, standing balance, and activity tolerance. Pt received supine in bed requesting to toilet. Completed squat pivot transfer bed>w/c and w/c>toilet with min assist and max cues for setup and safety. Pt required mod assist for squat pivot transfer toilet>w/c. Pt stood with mod assist standing balance to complete clothing management down and assisted with hygiene via sitting using lateral lean technique while adhering to precautions. Completed sit<>stand at sink 3x with mod-min assist standing balance to manage clothing around waist with increased time. Pt required min cues for sustained attention as he was perseverating on drinking water despite therapist educating on NPO multiple times. Pt required min cues for orientation to place then perseverated on leaving hospital last night, stating "I saw my grandkids swim last night." Pt left sitting in w/c with  wife present.  Session 2: Pt seen for 1:1 OT session with focus on functional mobility, standing balance, cognitive remediation, and activity  tolerance. Pt received following SLP session. Pt completed bathing and dressing sitting EOB with supervision for sitting balance. Pt required mod cues and increased time for use of AE and pt able to recall 1/3 back precautions. Pt completed sit<>stand from bed with mod assist balance while therapist assisted with clothing management around waist. Completed stepping with RLE in standing with mod-max standing balance to facilitate weight shift to right. Completed stepping with LLE in standing with target for increased control at hip flexors. Ambulated 10 feet with max assist and RUE over therapist's shoulder. Practiced squat pivot transfers bed<>w/c with pt requiring mod cues and min assist initially then progressed to min cues and supervision assist to complete. Pt left sitting in w/c with NT and wife present.   Therapy Documentation Precautions:  Precautions Precautions: Fall, Cervical, Back Precaution Comments: NPO with PEG Required Braces or Orthoses: Cervical Brace, Spinal Brace Cervical Brace: Hard collar Spinal Brace: Thoracolumbosacral orthotic, Applied in supine position Restrictions Weight Bearing Restrictions: No General:   Vital Signs:   Pain: Pain Assessment Pain Assessment: No/denies pain  See FIM for current functional status  Therapy/Group: Individual Therapy  Duayne Cal 07/24/2014, 10:33 AM

## 2014-07-24 NOTE — Progress Notes (Signed)
Speech Language Pathology Daily Session Note  Patient Details  Name: Hunter Myers MRN: [id number] Date of Birth: 04/28/1946  Today's Date: 07/24/2014 SLP Individual Time: 1005-1100 SLP Individual Time Calculation (min): 55 min  Short Term Goals: Week 3: SLP Short Term Goal 1 (Week 3): Patient will utilize an increased vocal intensity at the phrase level with Mod A multimodal cues.  SLP Short Term Goal 2 (Week 3): Patient will consume trials of Dys. 1 textures with nectar-thick liquids via tsp with minimal overt s/s of aspiration across 3 consecutive sessions with Mod A mutlimodal cues for utilization of swallowing compensatory strategies.   SLP Short Term Goal 3 (Week 3): Patient will peform pharyngeal strengthening exercises with Mod A multimodal cues.  SLP Short Term Goal 4 (Week 3): Patient will identify 2 physical and 2 cognitive deficits with Mod A multimodal cues.  SLP Short Term Goal 5 (Week 3): Patient will orient to time, place and situation with supervision multimodal cues.  SLP Short Term Goal 6 (Week 3): Patient will demonstrate sustained attention to a functional and familiar task for 15 minutes with Mod A multimodal cues cues.   Skilled Therapeutic Interventions:  Pt was seen for skilled ST targeting goals for dysphagia and cognition.  Upon arrival, pt was seated upright in wheelchair, awake, alert, and presented with questions related to his room number and use of his call bell.  SLP provided mod cues to refamiliarize pt to environmental aids in room to facilitate orientation to location.  Mod assist to review and reinforce use of call bell.  Pt was oriented to place and situation with min question cues and use of written aid present in room.  Following thorough oral care, SLP facilitated the session with trials of nectar thick liquids via cup sips.  Pt required overall mod assist multimodal cues for rate and portion control with PO trials.  Pt also benefited from min assist verbal  cues for use of intermittent throat clear.  Pt was noted with intermittently wet vocal quality which SLP suspects was related to loosening of pharyngeal secretions with trials as pt was noted to orally expectorate moderate amounts of thin, green mucus via strong cough and throat clear following cup sips.  Pt remained pleasantly interactive throughout therapy session and was easily redirected during all structured and unstructured activities.  Pt is making progress towards meeting goals.  Continue per current plan of care.    FIM:  Comprehension Comprehension Mode: Auditory Comprehension: 4-Understands basic 75 - 89% of the time/requires cueing 10 - 24% of the time Expression Expression Mode: Verbal Expression: 4-Expresses basic 75 - 89% of the time/requires cueing 10 - 24% of the time. Needs helper to occlude trach/needs to repeat words. Social Interaction Social Interaction: 5-Interacts appropriately 90% of the time - Needs monitoring or encouragement for participation or interaction. Problem Solving Problem Solving: 3-Solves basic 50 - 74% of the time/requires cueing 25 - 49% of the time Memory Memory: 2-Recognizes or recalls 25 - 49% of the time/requires cueing 51 - 75% of the time FIM - Eating Eating Activity: 5: Supervision/cues (with trials )  Pain Pain Assessment Pain Assessment: No/denies pain  Therapy/Group: Individual Therapy   Hunter Myers, M.A. CCC-SLP  Hunter Myers 07/24/2014, 12:04 PM
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
## 2014-07-24 NOTE — Plan of Care (Signed)
Problem: RH SKIN INTEGRITY Goal: RH STG SKIN FREE OF INFECTION/BREAKDOWN Remain free form skin breakdown while on Rehab with Min assist  Outcome: Progressing

## 2014-07-24 NOTE — Progress Notes (Signed)
Speech Language Pathology Weekly Progress and Session Note  Patient Details  Name: Hunter Myers MRN: [id number] Date of Birth: [birth year]  Beginning of progress report period: July 17, 2014 End of progress report period: July 24, 2014  Today's Date: 07/24/2014 SLP Individual Time: 1300-1350 SLP Individual Time Calculation (min): 50 min  Short Term Goals: Week 2: SLP Short Term Goal 1 (Week 2): Patient will demonstrate sustained attention to a functional and familiar task for 5 minutes with Max A multimodal cues cues.  SLP Short Term Goal 1 - Progress (Week 2): Met SLP Short Term Goal 2 (Week 2): Patient will orient to time, place and situation with Min A multimodal cues.  SLP Short Term Goal 2 - Progress (Week 2): Met SLP Short Term Goal 3 (Week 2): Patient will identify 2 physical and 2 cognitive deficits with Max A multimodal cues.  SLP Short Term Goal 3 - Progress (Week 2): Met SLP Short Term Goal 4 (Week 2): Patient will peform pharyngeal strengthening exercises with Max A multimodal cues.  SLP Short Term Goal 4 - Progress (Week 2): Met SLP Short Term Goal 5 (Week 2): Patient will consume trials of ice chips with minimal overt s/s of aspiration with Max A mutlimodal cues.  SLP Short Term Goal 5 - Progress (Week 2): Met SLP Short Term Goal 6 (Week 2): Patient will utilize an increased vocal intensity at the phrase level with Max A multimodal cues.  SLP Short Term Goal 6 - Progress (Week 2): Met    New Short Term Goals: Week 3: SLP Short Term Goal 1 (Week 3): Patient will utilize an increased vocal intensity at the phrase level with Mod A multimodal cues.  SLP Short Term Goal 2 (Week 3): Patient will consume trials of Dys. 1 textures with nectar-thick liquids via tsp with minimal overt s/s of aspiration across 3 consecutive sessions with Mod A mutlimodal cues for utilization of swallowing compensatory strategies.   SLP Short Term Goal 3 (Week 3): Patient will peform  pharyngeal strengthening exercises with Mod A multimodal cues.  SLP Short Term Goal 4 (Week 3): Patient will identify 2 physical and 2 cognitive deficits with Mod A multimodal cues.  SLP Short Term Goal 5 (Week 3): Patient will orient to time, place and situation with supervision multimodal cues.  SLP Short Term Goal 6 (Week 3): Patient will demonstrate sustained attention to a functional and familiar task for 15 minutes with Mod A multimodal cues cues.   Weekly Progress Updates: Patient has made functional gains and has met 5 of 5 STG's this reporting period due to increased attention, orientation, intellectual awareness, utilization of an increased vocal intensity and swallowing function.  Patient had a FEES on 11/17 and demonstrated an improvement in swallowing function, however, was recommended to remain NPO with trials of Dys. 1 textures with nectar-thick liquids via tsp with SLP only. Patient has been consuming trials without overt s/s of aspiration and requires Mod-Max A multimodal cues for utilization of multiple swallows, alternating solids/liquids and small bites/sips.  Patient continues to be dysphonic and requires overall Max A multimodal cues for utilization of an increased vocal intensity at the phrase level in order to increase overall intelligibility. Patient also requires overall Total A for working memory, Max A multimodal cues for sustained attention, intellectual awareness and functional problem solving and Min A for orientation. Patient and family education is ongoing, however, patient's family is unable to provide the necessary physical and cognitive assistance needed at this time,  therefore, patient's d/c plan has now changed to SNF. Patient would benefit from continued skilled SLP intervention to maximize cognitive-linguistic abilities and swallow function in order to maximize his overall independence prior to discharge.   Intensity: Minumum of 1-2 x/day, 30 to 90 minutes Frequency:  5 out of 7 days Duration/Length of Stay: TBD due to SNF placement  Treatment/Interventions: Cognitive remediation/compensation;Cueing hierarchy;Dysphagia/aspiration precaution training;Environmental controls;Functional tasks;Internal/external aids;Patient/family education;Speech/Language facilitation;Therapeutic Activities   Daily Session Skilled Therapeutic Interventions: Skilled treatment session focused on cognitive and dysphagia goals. Upon arrival, patient was awake and alert sitting upright in the wheelchair with his wife present and was agreeable to particiapte in treatment session. SLP facilitated session by providing Mod A multimodal cues for utilization of swallowing compensatory strategies of small bites/sips, alternating solids/liquids and multiple swallows with trials of Dys. 1 textures with nectar-thick liquids via teaspoon. Patient did not demonstrate any overt s/s of aspiration but demonstrated a strong, cued throat clear X 2 which was effective in expectorating mucous. Patient independently oriented to place and situation but required Min A multimodal cues for orientation to date. Patient also independently requested to use the bathroom and was successful with use of the urinal. Patient left with RN in his room. Continue with current plan of care.    FIM:  Comprehension Comprehension Mode: Auditory Comprehension: 4-Understands basic 75 - 89% of the time/requires cueing 10 - 24% of the time Expression Expression Mode: Verbal Expression: 4-Expresses basic 75 - 89% of the time/requires cueing 10 - 24% of the time. Needs helper to occlude trach/needs to repeat words. Social Interaction Social Interaction: 5-Interacts appropriately 90% of the time - Needs monitoring or encouragement for participation or interaction. Problem Solving Problem Solving: 3-Solves basic 50 - 74% of the time/requires cueing 25 - 49% of the time Memory Memory: 3-Recognizes or recalls 50 - 74% of the  time/requires cueing 25 - 49% of the time FIM - Eating Eating Activity: 5: Supervision/cues (with trials ) Pain Pain Assessment Pain Assessment: No/denies pain  Therapy/Group: Individual Therapy  Lucianne Smestad 07/24/2014, 1:53 PM  [place], [place], [place]

## 2014-07-24 NOTE — Progress Notes (Signed)
Physical Therapy Weekly Progress Note  Patient Details  Name: Hunter Myers MRN: [id number] Date of Birth: [birth year]  Beginning of progress report period: July 17, 2014 End of progress report period: July 24, 2014  Today's Date: 07/24/2014 PT Individual Time: 1400-1500 PT Individual Time Calculation (min): 60 min   Patient has met 4 of 5 short term goals. Pt has made good progression towards LTGs over last reporting period. Pt currently requires supervision-minA with dynamic sitting balance, bed mobility, and basic squat-pivot transfers; modA +2 persons for ambulation with use of EVA walker or 3 musketeers method; and mod A of 1 person to negotiate stairs. Pt currently requires mod-max cues for sustained attention to therapeutic tasks, intellectual/emergent awareness of impairments, and overall safety awareness including precautions. Pt demonstrating behaviors consistent with Rancho level VI.  Patient continues to demonstrate the following deficits: poor balance and balance strategies, decreased postural control, decreased ability to compensate for deficits, sustained attention, decreased awareness, impulsivity, activity tolerance, decreased functional use of B LE. and therefore will continue to benefit from skilled PT intervention to enhance overall performance with activity tolerance, balance, postural control, ability to compensate for deficits, functional use of  right lower extremity and left lower extremity, attention, awareness, coordination and knowledge of precautions.  Patient not progressing toward long term goals.  See goal revision..  Plan of care revisions: Pt downgraded LTGs to min A at w/c level due to slow progress, cognitive deficits/poor safety awareness, impulsivity, poor postural control, and R sided weakness. D/c plans changed to SNF.Marland Kitchen  PT Short Term Goals Week 1:  PT Short Term Goal 1 (Week 1): Patient will perform bed mobility with use of hospital bed functions and  modA. PT Short Term Goal 1 - Progress (Week 1): Met PT Short Term Goal 2 (Week 1): Patient will perform functional transfers with maxA x1. PT Short Term Goal 2 - Progress (Week 1): Met PT Short Term Goal 3 (Week 1): Patient will perform gait training 17' with +2 assist. PT Short Term Goal 3 - Progress (Week 1): Met PT Short Term Goal 4 (Week 1): Patient will negotiate 2 steps with B handrails and +2 assist. PT Short Term Goal 4 - Progress (Week 1): Met PT Short Term Goal 5 (Week 1): Patient will demonstrate carry over within session with mobility techniques with mod cues. PT Short Term Goal 5 - Progress (Week 1): Progressing toward goal Week 2:  PT Short Term Goal 1 (Week 2): Patient will perform bed mobility from flat surface without bedrails and minA. PT Short Term Goal 1 - Progress (Week 2): Met PT Short Term Goal 2 (Week 2): Patient will perform functional transfers with modA consistently. PT Short Term Goal 2 - Progress (Week 2): Met PT Short Term Goal 3 (Week 2): Patient will perform gait training x50' with LRAD and Cole. PT Short Term Goal 3 - Progress (Week 2): Progressing toward goal PT Short Term Goal 4 (Week 2): Patient will negotiate 5 steps with B handrails and modA. PT Short Term Goal 4 - Progress (Week 2): Met PT Short Term Goal 5 (Week 2): Patient will demonstrate carry over within session with mobility techniques with mod cues. PT Short Term Goal 5 - Progress (Week 2): Met Week 3:  PT Short Term Goal 1 (Week 3): Patient will perform bed mobility from flat bed surface without bed rails with supervision PT Short Term Goal 2 (Week 3): Patient will perform functional transfers with supervision 50% of the time PT Short  Term Goal 3 (Week 3): Patietn will perform gait training x50' with LRAD and modA of 1 person PT Short Term Goal 4 (Week 3): Patient will negotiate 5 steps with B handrails and min A PT Short Term Goal 5 (Week 3): Patient will demonstrate carry over across sessions  with mobility techniques with min cues  Skilled Therapeutic Interventions/Progress Updates:    Pt was found in room sitting up in chair with RN at side. Session focused on functional endurance during w/c propulsion, transfers, gait training, and NMR. Pt self-propelled w/c to rehab gym 75' using BUE and LLE with verbal cues for direction. Pt performed trial ambulation 50' in rehab gym using EVA walker, and mod A of 2, +2 for management of EVA walker, verbal cues provided to direct increased BOS and external cues on floor to maintain; gait characterized by non-fluid step-through gait pattern with dysmetric step lengths. Progressed pt to trial ambulation with RW 50' in rehab gym requiring modA +2 persons, therapist facilitating trunk flexion, increased base of support, and management of RW. Overall pt required greater assistance and decreased quality of gait with use of RW.  Therapy focused on NMR through tall-kneeling activity on gym mat to target proximal trunk control and promote weight shift onto L and R LE. Pt performed reaching activity in tall-kneeling position to further challenge pt's postural control and increase control of weight shift to L and R, with 5 second hold on reach to extend outside BOS with single UE support. Throughout session pt performed multiple sit-to-stand transfers requiring supervision-min A. Pt was left sitting up at nurse's station with soft waistbelt and elbow restraints in place; pt's RN notified for concerns regarding fit of pt's cervical collar.   Therapy Documentation Precautions:  Precautions Precautions: Fall, Cervical, Back Precaution Comments: NPO with PEG Required Braces or Orthoses: Cervical Brace, Spinal Brace Cervical Brace: Hard collar Spinal Brace: Thoracolumbosacral orthotic, Applied in supine position Restrictions Weight Bearing Restrictions: No Pain:   Locomotion : Ambulation Ambulation/Gait Assistance: 1: +2 Total assist;3: Mod assist   See FIM  for current functional status  Therapy/Group: Individual Therapy  Humbert Morozov 07/24/2014, 5:35 PM

## 2014-07-25 ENCOUNTER — Inpatient Hospital Stay (HOSPITAL_COMMUNITY): Payer: Medicare Other

## 2014-07-25 LAB — GLUCOSE, CAPILLARY
GLUCOSE-CAPILLARY: 145 mg/dL — AB (ref 70–99)
GLUCOSE-CAPILLARY: 97 mg/dL (ref 70–99)
Glucose-Capillary: 78 mg/dL (ref 70–99)
Glucose-Capillary: 80 mg/dL (ref 70–99)
Glucose-Capillary: 135 mg/dL — ABNORMAL HIGH (ref 70–99)

## 2014-07-25 NOTE — Progress Notes (Signed)
Physical Therapy Session Note  Patient Details  Name: Hunter MartesDwight Myers MRN: [id number] Date of Birth: 01/11/1946  Today's Date: 07/25/2014 PT Individual Time: 1400-1430 PT Individual Time Calculation (min): 30 min   Short Term Goals: Week 3:  PT Short Term Goal 1 (Week 3): Patient will perform bed mobility from flat bed surface without bed rails with supervision PT Short Term Goal 2 (Week 3): Patient will perform functional transfers with supervision 50% of the time PT Short Term Goal 3 (Week 3): Patietn will perform gait training x50' with LRAD and modA of 1 person PT Short Term Goal 4 (Week 3): Patient will negotiate 5 steps with B handrails and min A PT Short Term Goal 5 (Week 3): Patient will demonstrate carry over across sessions with mobility techniques with min cues  Skilled Therapeutic Interventions/Progress Updates:  1:1. Pt received sitting in w/c with family in day room, soft waist belt only in place. RN aware, stating trial for family to provide supervision without use of elbow/wrist restraints. Focus this session on functional endurance during w/c propulsion as well as cognitive remediation. Pt propelled w/c 125'x2 in hospital lobby as well as outside on uneven surfaces, including negotiation up/down ramp in both forwards and reverese directions with B UE/L LE, req close(S)-min A. Emphasis on safety awareness during w/c management down ramp for speed and awareness of obstacles. Pt inaccurately recalling events of past couple days, family aware that events were not correct but did not attempt to correct pt. Therapist cueing pt for improved orientation, memory and overall intellectual awareness. Pt left sitting in day room with family, soft waist belt in place. Daughter verbalized understanding to monitor pt for pulling at cervical collar or TLSO. RN aware.   Therapy Documentation Precautions:  Precautions Precautions: Fall, Cervical, Back Precaution Comments: NPO with PEG Required  Braces or Orthoses: Cervical Brace, Spinal Brace Cervical Brace: Hard collar Spinal Brace: Thoracolumbosacral orthotic, Applied in supine position Restrictions Weight Bearing Restrictions: No  See FIM for current functional status  Therapy/Group: Individual Therapy  [name], [name] S 07/25/2014, 4:49 PM

## 2014-07-25 NOTE — Plan of Care (Signed)
Problem: RH BOWEL ELIMINATION Goal: RH STG MANAGE BOWEL WITH ASSISTANCE STG Manage Bowel with max Assistance.  Outcome: Progressing Goal: RH STG MANAGE BOWEL W/MEDICATION W/ASSISTANCE STG Manage Bowel with Medication with max Assistance.  Outcome: Progressing  Problem: RH BLADDER ELIMINATION Goal: RH STG MANAGE BLADDER WITH ASSISTANCE STG Manage Bladder With Min Assistance  Outcome: Progressing  Problem: RH SKIN INTEGRITY Goal: RH STG SKIN FREE OF INFECTION/BREAKDOWN Remain free form skin breakdown while on Rehab with Min assist  Outcome: Progressing Goal: RH STG MAINTAIN SKIN INTEGRITY WITH ASSISTANCE STG Maintain Skin Integrity With mod Assistance.  Outcome: Progressing Goal: RH STG ABLE TO PERFORM INCISION/WOUND CARE W/ASSISTANCE STG Able To Perform Incision/Wound Care With total Assistance.  Outcome: Progressing  Problem: RH SAFETY Goal: RH STG ADHERE TO SAFETY PRECAUTIONS W/ASSISTANCE/DEVICE STG Adhere to Safety Precautions With max Assistance/Device.  Outcome: Progressing Goal: RH STG DECREASED RISK OF FALL WITH ASSISTANCE STG Decreased Risk of Fall With max Assistance.  Outcome: Progressing  Problem: RH PAIN MANAGEMENT Goal: RH STG PAIN MANAGED AT OR BELOW PT'S PAIN GOAL Pain level less than 3  Outcome: Progressing     

## 2014-07-25 NOTE — Plan of Care (Signed)
Problem: RH BOWEL ELIMINATION Goal: RH STG MANAGE BOWEL WITH ASSISTANCE STG Manage Bowel with max Assistance.  Outcome: Progressing Goal: RH STG MANAGE BOWEL W/MEDICATION W/ASSISTANCE STG Manage Bowel with Medication with max Assistance.  Outcome: Progressing  Problem: RH BLADDER ELIMINATION Goal: RH STG MANAGE BLADDER WITH ASSISTANCE STG Manage Bladder With Min Assistance  Outcome: Not Progressing Incontinent episode  Problem: RH SKIN INTEGRITY Goal: RH STG MAINTAIN SKIN INTEGRITY WITH ASSISTANCE STG Maintain Skin Integrity With mod Assistance.  Outcome: Progressing Goal: RH STG ABLE TO PERFORM INCISION/WOUND CARE W/ASSISTANCE STG Able To Perform Incision/Wound Care With total Assistance.  Outcome: Progressing  Problem: RH SAFETY Goal: RH STG ADHERE TO SAFETY PRECAUTIONS W/ASSISTANCE/DEVICE STG Adhere to Safety Precautions With max Assistance/Device.  Outcome: Not Progressing On restraints  Problem: RH PAIN MANAGEMENT Goal: RH STG PAIN MANAGED AT OR BELOW PT'S PAIN GOAL Pain level less than 3  Outcome: Progressing
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
## 2014-07-26 ENCOUNTER — Inpatient Hospital Stay (HOSPITAL_COMMUNITY): Payer: Medicare Other

## 2014-07-26 LAB — GLUCOSE, CAPILLARY
Glucose-Capillary: 109 mg/dL — ABNORMAL HIGH (ref 70–99)
Glucose-Capillary: 129 mg/dL — ABNORMAL HIGH (ref 70–99)
Glucose-Capillary: 153 mg/dL — ABNORMAL HIGH (ref 70–99)
Glucose-Capillary: 87 mg/dL (ref 70–99)
Glucose-Capillary: 88 mg/dL (ref 70–99)
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
## 2014-07-26 NOTE — Plan of Care (Signed)
Problem: RH BOWEL ELIMINATION Goal: RH STG MANAGE BOWEL WITH ASSISTANCE STG Manage Bowel with max Assistance.  Outcome: Progressing Goal: RH STG MANAGE BOWEL W/MEDICATION W/ASSISTANCE STG Manage Bowel with Medication with max Assistance.  Outcome: Progressing  Problem: RH BLADDER ELIMINATION Goal: RH STG MANAGE BLADDER WITH ASSISTANCE STG Manage Bladder With Min Assistance  Outcome: Progressing Helper assist with urinal position and empties  Problem: RH SKIN INTEGRITY Goal: RH STG SKIN FREE OF INFECTION/BREAKDOWN Remain free form skin breakdown while on Rehab with Min assist  Outcome: Progressing Goal: RH STG MAINTAIN SKIN INTEGRITY WITH ASSISTANCE STG Maintain Skin Integrity With mod Assistance.  Outcome: Progressing Goal: RH STG ABLE TO PERFORM INCISION/WOUND CARE W/ASSISTANCE STG Able To Perform Incision/Wound Care With total Assistance.  Outcome: Progressing  Problem: RH SAFETY Goal: RH STG ADHERE TO SAFETY PRECAUTIONS W/ASSISTANCE/DEVICE STG Adhere to Safety Precautions With max Assistance/Device.  Outcome: Progressing Goal: RH STG DECREASED RISK OF FALL WITH ASSISTANCE STG Decreased Risk of Fall With max Assistance.  Outcome: Progressing  Problem: RH PAIN MANAGEMENT Goal: RH STG PAIN MANAGED AT OR BELOW PT'S PAIN GOAL Pain level less than 3  Outcome: Progressing
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
## 2014-07-27 ENCOUNTER — Inpatient Hospital Stay (HOSPITAL_COMMUNITY): Payer: Medicare Other

## 2014-07-27 ENCOUNTER — Inpatient Hospital Stay (HOSPITAL_COMMUNITY): Payer: Medicare Other | Admitting: Speech Pathology

## 2014-07-27 LAB — GLUCOSE, CAPILLARY
GLUCOSE-CAPILLARY: 118 mg/dL — AB (ref 70–99)
GLUCOSE-CAPILLARY: 125 mg/dL — AB (ref 70–99)
Glucose-Capillary: 83 mg/dL (ref 70–99)
Glucose-Capillary: 72 mg/dL (ref 70–99)
Glucose-Capillary: 141 mg/dL — ABNORMAL HIGH (ref 70–99)

## 2014-07-27 NOTE — Plan of Care (Signed)
Problem: RH BOWEL ELIMINATION Goal: RH STG MANAGE BOWEL WITH ASSISTANCE STG Manage Bowel with max Assistance.  Outcome: Progressing Goal: RH STG MANAGE BOWEL W/MEDICATION W/ASSISTANCE STG Manage Bowel with Medication with max Assistance.  Outcome: Progressing  Problem: RH BLADDER ELIMINATION Goal: RH STG MANAGE BLADDER WITH ASSISTANCE STG Manage Bladder With Min Assistance  Outcome: Progressing  Problem: RH SKIN INTEGRITY Goal: RH STG SKIN FREE OF INFECTION/BREAKDOWN Remain free form skin breakdown while on Rehab with Min assist  Outcome: Progressing Goal: RH STG MAINTAIN SKIN INTEGRITY WITH ASSISTANCE STG Maintain Skin Integrity With mod Assistance.  Outcome: Progressing Goal: RH STG ABLE TO PERFORM INCISION/WOUND CARE W/ASSISTANCE STG Able To Perform Incision/Wound Care With total Assistance.  Outcome: Progressing  Problem: RH SAFETY Goal: RH STG ADHERE TO SAFETY PRECAUTIONS W/ASSISTANCE/DEVICE STG Adhere to Safety Precautions With max Assistance/Device.  Outcome: Progressing Goal: RH STG DECREASED RISK OF FALL WITH ASSISTANCE STG Decreased Risk of Fall With max Assistance.  Outcome: Progressing  Problem: RH PAIN MANAGEMENT Goal: RH STG PAIN MANAGED AT OR BELOW PT'S PAIN GOAL Pain level less than 3  Outcome: Progressing     

## 2014-07-27 NOTE — Progress Notes (Signed)
Physical Therapy Session Note  Patient Details  Name: Hunter Myers MRN: [id number] Date of Birth: 07/01/1946  Today's Date: 07/27/2014 PT Individual Time: PM session 1: 1400-1455 PM Session 2: 1625-1700 PT Individual Time Calculation (min): 60 min and 35 min   Short Term Goals: Week 3:  PT Short Term Goal 1 (Week 3): Patient will perform bed mobility from flat bed surface without bed rails with supervision PT Short Term Goal 2 (Week 3): Patient will perform functional transfers with supervision 50% of the time PT Short Term Goal 3 (Week 3): Patietn will perform gait training x50' with LRAD and modA of 1 person PT Short Term Goal 4 (Week 3): Patient will negotiate 5 steps with B handrails and min A PT Short Term Goal 5 (Week 3): Patient will demonstrate carry over across sessions with mobility techniques with min cues  Skilled Therapeutic Interventions/Progress Updates:    PM Session 1: Pt was found at nurse's station sitting up in w/c with soft waist belt in place. Session focused on functional endurance with w/c propulsion, cognitive remediation, and dynamic sitting balance. Pt was transported in w/c by therapist to [employer] for energy conservation, pt self-propelled w/c using BUE and LLE 150' on a slight incline to mimic ramp negotiation with supervision and min verbal cues to attend to task. Pt engaged in activity sitting on EOM on red air disk with reaching activity outside BOS with BLE support and intermittent UE support in order to challenge dynamic sitting balance and postural control with overall supervision. Memory challenged during seated balance task by having pt listen to various sequences of colored clip and placing colored clips in specific order on a bar, with min guard and min verbal cues to direct activity; pt was able to follow multiple-step commands. Pt reported feeling "very hot" and PT noted diaphoresis; given time to rest supine on mat. Pt reported feeling dizzy upon  supine>sit, orthostatic BP taken with change from 120/78 to 92/70 mmHg upon sitting up. PT transported pt total assist in w/c back to room where pt was left patient supine in bed to rest, with soft waist belt on and all needs in reach.   PM Session 2: Pt was found in room supine in bed with soft waist belt in place and nurse at bedside. Session focused on safety during transfers and mobility, activity tolerance, and gait training. Pt began grabbing at cervical collar, PT reviewed precautions and general safety plan with the pt. Pt self-propelled w/c room>rehab gym 150' in hospital hallway using BUE and LLE with supervision Pt performed gait training 485' around rehab gym using EVA walker and min-mod A +2, verbal cues provided to increase BOS and manual facilitation to assist RLE abduction and advancement. External feedback provided during second gait trial, blocks and ski poles were placed in path with instructions given to keep one foot on either side of object to facilitate and maintain BOS. Pt performed second gait trial with EVA walker around rehab gym x85', mod A +2, verbal cues to increase BOS and for obstacle clearance, and increased manual facilitation of RLE for advancement due to increased cognitive demand of task. Pt was transported back to nurse's station in w/c and left sitting up in wheelchair with soft waist belt in place, RN aware.  Therapy Documentation Precautions:  Precautions Precautions: Fall, Cervical, Back Precaution Comments: NPO with PEG Required Braces or Orthoses: Cervical Brace, Spinal Brace Cervical Brace: Hard collar Spinal Brace: Thoracolumbosacral orthotic, Applied in supine position Restrictions Weight  Bearing Restrictions: No Vital Signs: Therapy Vitals Temp: 98.5 F (36.9 C) Temp Source: Oral Pulse Rate: 80 Resp: 19 BP: 106/70 mmHg Patient Position (if appropriate): Lying Oxygen Therapy SpO2: 99 % O2 Device: Not Delivered Pain: Pain Assessment Pain  Assessment: No/denies pain Locomotion : Ambulation Ambulation/Gait Assistance: 1: +2 Total assist;3: Mod assist   See FIM for current functional status  Therapy/Group: Individual Therapy  Hunter Myers 07/27/2014, 5:05 PM

## 2014-07-27 NOTE — Progress Notes (Signed)
Occupational Therapy Session Note  Patient Details  Name: Hunter MartesDwight Ozment MRN: [id number] Date of Birth: 09/18/1945  Today's Date: 07/27/2014 OT Individual Time: [id number] and 1300-1400  OT Individual Time Calculation (min): 42 min  and 60 min  Today's Date: 07/27/2014 OT Missed Time: 18 Minutes Missed Time Reason: Nursing care   Short Term Goals: Week 3:  OT Short Term Goal 1 (Week 3): Pt will complete toilet task with mod assist OT Short Term Goal 2 (Week 3): Pt will consistently complete toilet transfers at supervision level and min cues OT Short Term Goal 3 (Week 3): Pt will be oriented to place, time, and situation with supervision cues OT Short Term Goal 4 (Week 3): Pt will complete LB dressing with mod assist and min cues for use of AE  Skilled Therapeutic Interventions/Progress Updates:    Session 1; Pt seen for ADL retraining with focus on standing balance, functional transfers, use of AE, and sitting balance. Pt received supine in bed with RN providing tube feed (missed 18 min). Therapist donned new cervical collar while pt in supine. Completed oral care sitting EOB with max cues for termination of task. Completed bathing and dressing sitting EOB with supervision for sitting balance and mod assist for standing balance during clothing management around waist. Pt required mod cues for sustained attention d/t perseveration on oral care. Pt completed squat pivot transfer bed>w/c with min assist and min cues for setup. Pt left sitting in w/c at nurses station.   Session 2: Pt seen for 1:1 OT session with focus on functional transfers, standing balance, postural control, and UE ROM. Pt received sitting in w/c with family present. Practiced squat pivot transfers w/c<>toilet with pt initially requiring min assist and mod cues for sequencing then progressed to supervision level with min cues for sequencing. Pt very motivated by this, becoming tearful at end of activity. Pt propelled self to  therapy gym using BUEs. Engaged in standing activity with min assist for balance and LUE supported on table while using RUE to place clothes pins on vertical target. Pt with full AROM in RUE to complete task, however fatigued quickly. Pt with 1 LOB in standing, requiring max assist to correct. Pt began perseverating on feeling "warm" and quickly escalated. All vitals WFL. Returned to room and provided cool wash cloth to forehead and wrists. Pt required max cues to keep hands from cervical collar and for attention to functional task d/t poor frustration tolerance. Pt left sitting in w/c at nurses station for PT session.   Therapy Documentation Precautions:  Precautions Precautions: Fall, Cervical, Back Precaution Comments: NPO with PEG Required Braces or Orthoses: Cervical Brace, Spinal Brace Cervical Brace: Hard collar Spinal Brace: Thoracolumbosacral orthotic, Applied in supine position Restrictions Weight Bearing Restrictions: No General: General OT Amount of Missed Time: 18 Minutes Vital Signs: Therapy Vitals Temp: 98.4 F (36.9 C) Pulse Rate: 85 BP: 113/71 mmHg Pain: No report of pain.  See FIM for current functional status  Therapy/Group: Individual Therapy  [name], Rina Adney N 07/27/2014, 11:37 AM

## 2014-07-27 NOTE — Progress Notes (Signed)
Speech Language Pathology Daily Session Note  Patient Details  Name: Hunter Myers MRN: [id number] Date of Birth: 12/19/1945  Today's Date: 07/27/2014 SLP Individual Time: 1100-1200 SLP Individual Time Calculation (min): 60 min  Short Term Goals: Week 3: SLP Short Term Goal 1 (Week 3): Patient will utilize an increased vocal intensity at the phrase level with Mod A multimodal cues.  SLP Short Term Goal 2 (Week 3): Patient will consume trials of Dys. 1 textures with nectar-thick liquids via tsp with minimal overt s/s of aspiration across 3 consecutive sessions with Mod A mutlimodal cues for utilization of swallowing compensatory strategies.   SLP Short Term Goal 3 (Week 3): Patient will peform pharyngeal strengthening exercises with Mod A multimodal cues.  SLP Short Term Goal 4 (Week 3): Patient will identify 2 physical and 2 cognitive deficits with Mod A multimodal cues.  SLP Short Term Goal 5 (Week 3): Patient will orient to time, place and situation with supervision multimodal cues.  SLP Short Term Goal 6 (Week 3): Patient will demonstrate sustained attention to a functional and familiar task for 15 minutes with Mod A multimodal cues cues.   Skilled Therapeutic Interventions: Skilled treatment session focused on addressing dysphagia and cognitive goals. Upon arrival, patient was sitting upright in his wheelchair with family members present. SLP facilitated session by providing Min A multimodal cues for utilization of multiple swallows, alternating solids/liquids and small bites/sips with trials of Dys. 1 textures and nectar-thick liquids via teaspoon without overt s/s of aspiration. Patient also participated in oral care and required supervision verbal cues for functional problem solving throughout the task. Patient was oriented to place and situation but required Min A multimodal cues for orientation to date. [name] wife present and re-educated on patient's current swallowing function and SLP  goals. She verbalized understanding. Patient was left in wheelchair with visitors present. Continue with current plan of care.    FIM:  Comprehension Comprehension Mode: Auditory Comprehension: 4-Understands basic 75 - 89% of the time/requires cueing 10 - 24% of the time Expression Expression Mode: Verbal Expression: 5-Expresses basic needs/ideas: With no assist Social Interaction Social Interaction: 4-Interacts appropriately 75 - 89% of the time - Needs redirection for appropriate language or to initiate interaction. Problem Solving Problem Solving: 3-Solves basic 50 - 74% of the time/requires cueing 25 - 49% of the time Memory Memory: 3-Recognizes or recalls 50 - 74% of the time/requires cueing 25 - 49% of the time FIM - Eating Eating Activity: 5: Supervision/cues (with trials from SLP)  Pain Pain Assessment Pain Assessment: No/denies pain  Therapy/Group: Individual Therapy  Hunter Myers 07/27/2014, 4:02 PM

## 2014-07-27 NOTE — Progress Notes (Signed)
[place] PHYSICAL MEDICINE & REHABILITATION     PROGRESS NOTE    Subjective/Complaints: Had a fair night. No complaints this am.    ROS limited by mental status  Objective: Vital Signs: Blood pressure 106/57, pulse 65, temperature 97.9 F (36.6 C), temperature source Oral, resp. rate 18, height 5\' 10"  (1.778 m), weight 54.432 kg (120 lb), SpO2 100 %. No results found. No results for input(s): WBC, HGB, HCT, PLT in the last 72 hours. No results for input(s): NA, K, CL, GLUCOSE, BUN, CREATININE, CALCIUM in the last 72 hours.  Invalid input(s): CO CBG (last 3)   Recent Labs  07/26/14 2357 07/27/14 0629 07/27/14 0738  GLUCAP 153* 83 72    Wt Readings from Last 3 Encounters:  07/27/14 54.432 kg (120 lb)  07/09/14 58.514 kg (129 lb)    Physical Exam:   Constitutional: He appears well-developed.   Cervical collar in place HENT: Voice is hoarse but air leakage through trach, Head: Normocephalic and atraumatic.  Eyes: Conjunctivae are normal. Pupils are equal, round, and reactive to light.  Neck: air escaping from stoma--area clean. Cardiovascular: Regular rhythm.  Respiratory: Effort normal and breath sounds normal. No respiratory distress. He has no wheezes.  GI: Soft. Bowel sounds are normal. He exhibits no distension.  PEG site clean. Mild tenderness around the PEG site Musculoskeletal: He exhibits no edema.  Unable to raise left shoulder.  Neurological: He is alert.  Oriented to self. Lacks awareness and insight into current deficits. Less Restless. dysphonia persists.     3+ prox to 4/5 distally in UE's. LE: Left HF 3/5. RHF 2/5. 3 minus bilateral ankle dorsiflexors Skin: Skin is warm and dry. Mild erythema around the PICC site right medial arm Bilateral heels dry    Assessment/Plan: 1. 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 Physiatrist is providing close team supervision and 24 hour management of active medical problems listed below. Physiatrist and rehab team continue to assess barriers to discharge/monitor patient progress toward functional and medical goals. FIM: FIM - Bathing Bathing Steps Patient Completed: Chest, Right Arm, Left Arm, Abdomen, Front perineal area, Buttocks, Right upper leg, Left upper leg Bathing: 4: Min-Patient completes 8-9 6346f 10 parts or 75+ percent  FIM - Upper Body Dressing/Undressing Upper body dressing/undressing steps patient completed: Thread/unthread right sleeve of front closure shirt/dress, Button/unbutton shirt Upper body dressing/undressing: 3: Mod-Patient completed 50-74% of tasks FIM - Lower Body Dressing/Undressing Lower body dressing/undressing steps patient completed: Thread/unthread right pants leg, Thread/unthread left pants leg, Pull pants up/down, Don/Doff right sock, Don/Doff left sock Lower body dressing/undressing: 3: Mod-Patient completed 50-74% of tasks  FIM - Toileting Toileting steps completed by patient: Performs perineal hygiene Toileting Assistive Devices: Grab bar or rail for support Toileting: 2: Max-Patient completed 1 of 3 steps (Pt with no LB clothing on at time)  FIM - Diplomatic Services operational officerToilet Transfers Toilet Transfers Assistive Devices: PsychiatristBedside commode Toilet Transfers: 4-To toilet/BSC: Min A (steadying Pt. > 75%), 4-From toilet/BSC: Min A (steadying Pt. > 75%)  FIM - Bed/Chair Transfer Bed/Chair Transfer Assistive Devices: Arm rests Bed/Chair Transfer: 4: Supine > Sit: Min A (steadying Pt. > 75%/lift 1 leg), 4: Bed > Chair or W/C: Min A (steadying Pt. > 75%)  FIM - Locomotion: Wheelchair Distance: 150' Locomotion: Wheelchair: 2: Travels 50 - 149 ft with minimal assistance (Pt.>75%) FIM - Locomotion: Ambulation Locomotion: Health visitorAmbulation Assistive  Devices: Walker Carley Hammed- Eva, Walker - Rolling Ambulation/Gait Assistance: 1: +2  Total assist, 3: Mod assist Locomotion: Ambulation: 0: Activity did not occur  Comprehension Comprehension Mode: Auditory Comprehension: 5-Understands basic 90% of the time/requires cueing < 10% of the time  Expression Expression Mode: Verbal Expression: 5-Expresses basic 90% of the time/requires cueing < 10% of the time.  Social Interaction Social Interaction: 4-Interacts appropriately 75 - 89% of the time - Needs redirection for appropriate language or to initiate interaction.  Problem Solving Problem Solving: 4-Solves basic 75 - 89% of the time/requires cueing 10 - 24% of the time  Memory Memory: 4-Recognizes or recalls 75 - 89% of the time/requires cueing 10 - 24% of the time Medical Problem List and Plan: 1. Functional deficits secondary to TBI, cervical and thoracic spine fractures.   -Surgery [place] be in [place] for at least 3 months. Can be up to 30 degrees without TLSO  -cervical xr without fx but demineralization and diffuse facet disease  -thoracic films display fractures/l1 end plate fx  2. DVT Prophylaxis/Anticoagulation: Pharmaceutical: Lovenox 3. Pain Management: Will continue oxycodone bid with prn doses as needed. Lidocaine patch for neck pain  -gabapentin trial for feet 4. H/o anxiety disorder/Mood: Will continue xanax prn for now. Patient has poor insight and lacks awareness of deficits. LCSW to follow along for support and evaluation when indicated.  5. Neuropsych: This patient is not capable of making decisions on his own behalf.  -still requires restraints for safety 6. Skin/Wound Care: Pressure relief measures. Continue air mattress overlay.  Eucerin cream for dry skin bilateral feet.  7. Fluids/Electrolytes/Nutrition: Monitor I/O. Electrolytes/bun/cr all look reasonable  -continue TF-on bolus schedule now  8. ABLA:  hgb 11.5 9. Pulmonary: trach out. Stoma closed.   10.  Severe dysphagia, NPO continues  -?trials with SLP soon 11. Low  am cbg's  LOS (Days) 18 A FACE TO FACE EVALUATION WAS PERFORMED  Leva Baine T 07/27/2014 8:01 AM

## 2014-07-28 ENCOUNTER — Inpatient Hospital Stay (HOSPITAL_COMMUNITY): Payer: Medicare Other | Admitting: *Deleted

## 2014-07-28 ENCOUNTER — Inpatient Hospital Stay (HOSPITAL_COMMUNITY): Payer: Medicare Other

## 2014-07-28 ENCOUNTER — Inpatient Hospital Stay (HOSPITAL_COMMUNITY): Payer: Medicare Other | Admitting: Speech Pathology

## 2014-07-28 LAB — BASIC METABOLIC PANEL
Anion gap: 10 (ref 5–15)
BUN: 25 mg/dL — ABNORMAL HIGH (ref 6–23)
CALCIUM: 9.1 mg/dL (ref 8.4–10.5)
CO2: 28 meq/L (ref 19–32)
Chloride: 101 mEq/L (ref 96–112)
Creatinine, Ser: 0.62 mg/dL (ref 0.50–1.35)
GFR calc Af Amer: >90 mL/min (ref >90)
GFR calc non Af Amer: >90 mL/min (ref >90)
GLUCOSE: 121 mg/dL — AB (ref 70–99)
Potassium: 4.8 mEq/L (ref 3.7–5.3)
Sodium: 139 mEq/L (ref 137–147)

## 2014-07-28 LAB — GLUCOSE, CAPILLARY
Glucose-Capillary: 137 mg/dL — ABNORMAL HIGH (ref 70–99)
Glucose-Capillary: 81 mg/dL (ref 70–99)

## 2014-07-28 MED ORDER — QUETIAPINE FUMARATE 50 MG PO TABS
50.0000 mg | ORAL_TABLET | Freq: Every day | ORAL | Status: DC
  Administered 2014-07-28 – 2014-08-05 (×9): 50 mg
  Filled 2014-07-28 (×11): qty 1

## 2014-07-28 MED ORDER — FREE WATER
300.0000 mL | Freq: Every day | Status: DC
  Administered 2014-07-28 – 2014-07-31 (×13): 300 mL

## 2014-07-28 NOTE — Progress Notes (Signed)
Physical Therapy Session Note  Patient Details  Name: Hunter MartesDwight Rezendes MRN: [id number] Date of Birth: 12/28/1945  Today's Date: 07/28/2014 PT Individual Time: 1345-1445 PT Individual Time Calculation (min): 60 min   Short Term Goals: Week 3:  PT Short Term Goal 1 (Week 3): Patient will perform bed mobility from flat bed surface without bed rails with supervision PT Short Term Goal 2 (Week 3): Patient will perform functional transfers with supervision 50% of the time PT Short Term Goal 3 (Week 3): Patietn will perform gait training x50' with LRAD and modA of 1 person PT Short Term Goal 4 (Week 3): Patient will negotiate 5 steps with B handrails and min A PT Short Term Goal 5 (Week 3): Patient will demonstrate carry over across sessions with mobility techniques with min cues  Skilled Therapeutic Interventions/Progress Updates:    Patient received sitting in wheelchair at RN station with R toe off AFO donned. Session focused on functional mobility, dynamic standing balance, and cognitive remediation. Wheelchair mobility >150' with B LEs and supervision, noted improved ability to advance and clear R foot during propulsion. Gait training in controlled environment x100' with Fara BorosEva Walker, modA overall with +2 for stabilization of Fara Borosva Walker to prevent it getting too far ahead of patient. Patient initially performing step-to pattern, then progressing to step through with pause at each step, then progressing to more fluid, reciprocal gait pattern.  Stair negotiation x5 steps with B handrails and minA; patient demonstrating improvement with short term memory/carry over for sequencing after initial instruction. Standing horseshoe toss with dual task of patient keeping score with min-modA for unsupported standing balance. NuStep Level 6>Level 4>Level 2 due to patient tolerance/fatigue with B UE/LE with task of keeping track of time of activity; patient requires mod cues for keeping track of time.  Patient left  sitting in wheelchair with soft waistbelt in place at RN station.  Therapy Documentation Precautions:  Precautions Precautions: Fall, Cervical, Back Precaution Comments: NPO with PEG Required Braces or Orthoses: Cervical Brace, Spinal Brace Cervical Brace: Hard collar Spinal Brace: Thoracolumbosacral orthotic, Applied in supine position Restrictions Weight Bearing Restrictions: No Pain: Pain Assessment Pain Assessment: No/denies pain Pain Score: 0-No pain Locomotion : Ambulation Ambulation/Gait Assistance: 1: +2 Total assist;3: Mod assist (+2 for Eva stabilization; modA for actual gait training) Wheelchair Mobility Distance: 150   See FIM for current functional status  Therapy/Group: Individual Therapy  Chipper HerbBridget S Caleah Tortorelli S. Magie Ciampa, PT, DPT 07/28/2014, 2:31 PM

## 2014-07-28 NOTE — Progress Notes (Signed)
[place] PHYSICAL MEDICINE & REHABILITATION     PROGRESS NOTE    Subjective/Complaints: Restless at times. Didn't get out of bed. .    ROS limited by mental status  Objective: Vital Signs: Blood pressure 117/76, pulse 75, temperature 98.1 F (36.7 C), temperature source Oral, resp. rate 18, height 5\' 10"  (1.778 m), weight 54.885 kg (121 lb), SpO2 98 %. No results found. No results for input(s): WBC, HGB, HCT, PLT in the last 72 hours. No results for input(s): NA, K, CL, GLUCOSE, BUN, CREATININE, CALCIUM in the last 72 hours.  Invalid input(s): CO CBG (last 3)   Recent Labs  07/27/14 2050 07/28/14 0019 07/28/14 0353  GLUCAP 141* 137* 81    Wt Readings from Last 3 Encounters:  07/28/14 54.885 kg (121 lb)  07/09/14 58.514 kg (129 lb)    Physical Exam:   Constitutional: He appears well-developed.   Cervical collar in place HENT: Voice is hoarse but air leakage through trach, Head: Normocephalic and atraumatic.  Eyes: Conjunctivae are normal. Pupils are equal, round, and reactive to light.  Neck: air escaping from stoma--area clean. Cardiovascular: Regular rhythm.  Respiratory: Effort normal and breath sounds normal. No respiratory distress. He has no wheezes.  GI: Soft. Bowel sounds are normal. He exhibits no distension.  PEG site clean. Mild tenderness around the PEG site Musculoskeletal: He exhibits no edema.  Unable to raise left shoulder.  Neurological: He is alert.  Oriented to self. Lacks awareness and insight into current deficits. Less Restless. dysphonia persists.     3+ prox to 4/5 distally in UE's. LE: Left HF 3/5. RHF 2/5. 3 minus bilateral ankle dorsiflexors Skin: Skin is warm and dry. Mild erythema around the PICC site right medial arm Bilateral heels dry    Assessment/Plan: 1. 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 Physiatrist is providing close team supervision and 24 hour management of active medical problems listed below. Physiatrist and rehab team continue to assess barriers to discharge/monitor patient progress toward functional and medical goals. FIM: FIM - Bathing Bathing Steps Patient Completed: Chest, Right Arm, Left Arm, Abdomen, Front perineal area, Right upper leg, Left upper leg Bathing: 3: Mod-Patient completes 5-7 1232f 10 parts or 50-74%  FIM - Upper Body Dressing/Undressing Upper body dressing/undressing steps patient completed: Thread/unthread right sleeve of pullover shirt/dresss, Thread/unthread left sleeve of pullover shirt/dress, Pull shirt over trunk Upper body dressing/undressing: 4: Min-Patient completed 75 plus % of tasks FIM - Lower Body Dressing/Undressing Lower body dressing/undressing steps patient completed: Thread/unthread right pants leg, Thread/unthread left pants leg, Pull pants up/down, Don/Doff right sock, Don/Doff left sock Lower body dressing/undressing: 3: Mod-Patient completed 50-74% of tasks  FIM - Toileting Toileting steps completed by patient: Performs perineal hygiene Toileting Assistive Devices: Grab bar or rail for support Toileting: 2: Max-Patient completed 1 of 3 steps (Pt with no LB clothing on at time)  FIM - Diplomatic Services operational officerToilet Transfers Toilet Transfers Assistive Devices: Grab bars Toilet Transfers: 4-To toilet/BSC: Min A (steadying Pt. > 75%), 4-From toilet/BSC: Min A (steadying Pt. > 75%), 5-To toilet/BSC: Supervision (verbal cues/safety issues), 5-From toilet/BSC: Supervision (verbal cues/safety issues)  FIM - BankerBed/Chair Transfer Bed/Chair Transfer Assistive Devices: Arm rests, Bed rails Bed/Chair Transfer: 4: Supine > Sit: Min A (steadying Pt. > 75%/lift 1 leg), 4: Bed > Chair or W/C: Min A (steadying Pt. > 75%), 4: Chair or W/C >  Bed: Min A (steadying Pt. > 75%), 4: Sit > Supine: Min A (steadying pt. > 75%/lift 1  leg)  FIM - Locomotion: Wheelchair Distance: 150' Locomotion: Wheelchair: 5: Travels 150 ft or more: maneuvers on rugs and over door sills with supervision, cueing or coaxing FIM - Locomotion: Ambulation Locomotion: Ambulation Assistive Devices: Interior and spatial designerWalker - Eva Ambulation/Gait Assistance: 1: +2 Total assist, 3: Mod assist Locomotion: Ambulation: 1: Two helpers  Comprehension Comprehension Mode: Asleep Comprehension: 4-Understands basic 75 - 89% of the time/requires cueing 10 - 24% of the time  Expression Expression Mode: Asleep Expression: 5-Expresses basic needs/ideas: With no assist  Social Interaction Social Interaction Mode: Asleep Social Interaction: 4-Interacts appropriately 75 - 89% of the time - Needs redirection for appropriate language or to initiate interaction.  Problem Solving Problem Solving Mode: Asleep Problem Solving: 3-Solves basic 50 - 74% of the time/requires cueing 25 - 49% of the time  Memory Memory Mode: Asleep Memory: 3-Recognizes or recalls 50 - 74% of the time/requires cueing 25 - 49% of the time Medical Problem List and Plan: 1. Functional deficits secondary to TBI, cervical and thoracic spine fractures.   -Surgery [place] that pt be in [place] for at least 3 months. Can be up to 30 degrees without TLSO  -cervical xr without fx but demineralization and diffuse facet disease  -thoracic films display fractures/l1 end plate fx  2. DVT Prophylaxis/Anticoagulation: Pharmaceutical: Lovenox 3. Pain Management: Will continue oxycodone bid with prn doses as needed. Lidocaine patch for neck pain  -gabapentin trial for feet 4. H/o anxiety disorder/Mood: Will continue xanax prn for now. Patient has poor insight and lacks awareness of deficits. LCSW to follow along for support and evaluation when indicated.  5. Neuropsych: This patient is not capable of making decisions on his own behalf.  -still requires restraints for safety  -low bed  -seems to be  doing better with seroquel hs---increase to 50 6. Skin/Wound Care: Pressure relief measures. Continue air mattress overlay.  Eucerin cream for dry skin bilateral feet.  7. Fluids/Electrolytes/Nutrition: Monitor I/O. Electrolytes/bun/cr all look reasonable  -continue TF-on bolus schedule now  8. ABLA:  hgb 11.5 9. Pulmonary: trach out. Stoma closed.   10.  Severe dysphagia, NPO continues  -?trials with SLP soon 11. Low am cbg's  LOS (Days) 19 A FACE TO FACE EVALUATION WAS PERFORMED  SWARTZ,ZACHARY T 07/28/2014 7:57 AM

## 2014-07-28 NOTE — Progress Notes (Signed)
Speech Language Pathology Daily Session Note  Patient Details  Name: Hunter MartesDwight Myers MRN: [id number] Date of Birth: 02/17/1946  Today's Date: 07/28/2014 SLP Individual Time: 6213-08651030-1115 SLP Individual Time Calculation (min): 45 min  Short Term Goals: Week 3: SLP Short Term Goal 1 (Week 3): Patient will utilize an increased vocal intensity at the phrase level with Mod A multimodal cues.  SLP Short Term Goal 2 (Week 3): Patient will consume trials of Dys. 1 textures with nectar-thick liquids via tsp with minimal overt s/s of aspiration across 3 consecutive sessions with Mod A mutlimodal cues for utilization of swallowing compensatory strategies.   SLP Short Term Goal 3 (Week 3): Patient will peform pharyngeal strengthening exercises with Mod A multimodal cues.  SLP Short Term Goal 4 (Week 3): Patient will identify 2 physical and 2 cognitive deficits with Mod A multimodal cues.  SLP Short Term Goal 5 (Week 3): Patient will orient to time, place and situation with supervision multimodal cues.  SLP Short Term Goal 6 (Week 3): Patient will demonstrate sustained attention to a functional and familiar task for 15 minutes with Mod A multimodal cues cues.   Skilled Therapeutic Interventions: Skilled treatment session focused on addressing dysphagia and cognitive goals. Upon arrival, patient was sitting upright in his wheelchair and agreeable to participate in treatment session. SLP facilitated session by providing Min A multimodal cues for utilization of multiple swallows, alternating solids/liquids and small bites/sips with trials of Dys. 1 textures and nectar-thick liquids via teaspoon without overt s/s of aspiration. Patient was oriented to place and situation but required Min A multimodal cues for orientation to date.  Patient initiated spontaneous conversation and demonstrated increased vocal intensity and was 100% intelligible at the sentence level. Patient was left in wheelchair at RN station. Continue  with current plan of care.   FIM:  Comprehension Comprehension Mode: Auditory Comprehension: 4-Understands basic 75 - 89% of the time/requires cueing 10 - 24% of the time Expression Expression Mode: Verbal Expression: 5-Expresses basic needs/ideas: With extra time/assistive device Social Interaction Social Interaction: 5-Interacts appropriately 90% of the time - Needs monitoring or encouragement for participation or interaction. Problem Solving Problem Solving: 3-Solves basic 50 - 74% of the time/requires cueing 25 - 49% of the time Memory Memory: 3-Recognizes or recalls 50 - 74% of the time/requires cueing 25 - 49% of the time FIM - Eating Eating Activity: 5: Supervision/cues (with SLP trials )  Pain Pain Assessment Pain Assessment: No/denies pain Pain Score: 0-No pain  Therapy/Group: Individual Therapy  Bernisha Verma 07/28/2014, 3:19 PM  Feliberto Gottronourtney Sahira Cataldi, MA, CCC-SLP [phone]
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
## 2014-07-28 NOTE — Progress Notes (Signed)
Physical Therapy Session Note  Patient Details  Name: Hunter Myers MRN: [id number] Date of Birth: 02/12/1946  Today's Date: 07/28/2014 PT Individual Time: 1530-1630 PT Individual Time Calculation (min): 60 min   Short Term Goals: Week 3:  PT Short Term Goal 1 (Week 3): Patient will perform bed mobility from flat bed surface without bed rails with supervision PT Short Term Goal 2 (Week 3): Patient will perform functional transfers with supervision 50% of the time PT Short Term Goal 3 (Week 3): Patietn will perform gait training x50' with LRAD and modA of 1 person PT Short Term Goal 4 (Week 3): Patient will negotiate 5 steps with B handrails and min A PT Short Term Goal 5 (Week 3): Patient will demonstrate carry over across sessions with mobility techniques with min cues  Skilled Therapeutic Interventions/Progress Updates:    Pt found sitting up in w/c at nurse's station with soft waist belt on. Session focused on functional mobility, cognitive remediation, activity tolerance, and dynamic standing balance. Pt taken to restroom after stating need to use bathroom, t/f to toilet requiring min A for stability and min A for standing balance, pt assisted with clothing management and self-care after toiletting; pt stood at sink to wash hands and challenge standing balance. Pt self-propelled w/c using BUE and BLE 150' with supervision. Pt engaged in checkers game while standing to increase standing tolerance with checker board placed a few feet in front of pt to decrease posterior lean. Pt sustained attention to task for 30 minutes with one sitting rest break due to fatigue of LEs. Pt transported back to room total assist and returned to bed (sit>supine) with min A. Pt left supine in bed with soft waist belt in place and bed alarm set, all needs within reach.   Therapy Documentation Precautions:  Precautions Precautions: Fall, Cervical, Back Precaution Comments: NPO with PEG Required Braces or  Orthoses: Cervical Brace, Spinal Brace Cervical Brace: Hard collar Spinal Brace: Thoracolumbosacral orthotic, Applied in supine position Restrictions Weight Bearing Restrictions: No  Pain: Pain Assessment Pain Assessment: No/denies pain Pain Score: 0-No pain Locomotion : Ambulation Ambulation/Gait Assistance: 1: +2 Total assist;3: Mod assist Wheelchair Mobility Distance: 150   See FIM for current functional status  Therapy/Group: Individual Therapy  [name] 07/28/2014, 4:38 PM

## 2014-07-28 NOTE — Progress Notes (Signed)
Patient getting CBG checks as "point of care" per RD. Will d/c this as hgb A1c WNL, BS have been stable with bolus TF and he does not need CBGs every 4 hours. . Plans starting po's tomorrow per ST.

## 2014-07-28 NOTE — Progress Notes (Signed)
Good night, no attempts OOB, but has pulled c-collar off couple of times. Requiring assist with placing and holding urinal. Hunter Myers, Hunter Myers

## 2014-07-28 NOTE — Progress Notes (Signed)
Occupational Therapy Session Note  Patient Details  Name: Hunter MartesDwight Myers MRN: [id number] Date of Birth: 01/15/1946  Today's Date: 07/28/2014 OT Individual Time: [id number] and 1300-1345 OT Individual Time Calculation (min): 35 min and 45 min   Missed 25 min due nursing care (RN providing tube feed)   Short Term Goals: Week 3:  OT Short Term Goal 1 (Week 3): Pt will complete toilet task with mod assist OT Short Term Goal 2 (Week 3): Pt will consistently complete toilet transfers at supervision level and min cues OT Short Term Goal 3 (Week 3): Pt will be oriented to place, time, and situation with supervision cues OT Short Term Goal 4 (Week 3): Pt will complete LB dressing with mod assist and min cues for use of AE  Skilled Therapeutic Interventions/Progress Updates:    Session 1: Pt seen for ADL retraining with focus on functional transfers, standing balance, cognitive remediation, and activity tolerance. Pt received supine in bed following tube feeding from RN. Completed squat pivot transfer bed>w/c with min assist and min cues for setup. Completed squat pivot transfer w/c<>toilet with CGA and min cues. Pt required mod assist for standing balance during clothing management and hygiene. Completed dressing from w/c level with increased time and no cues for use of AE. Pt required min-mod assist standing balance during clothing management around waist d/t posterior lean. Pt handed off to SLP at end of session.   Session 2: Pt seen for 1:1 OT session with focus on standing balance, safety awareness, and sit<>stand. Pt received supine in bed requesting to don short d/t feeling "hot." Completed LB dressing sitting EOB with max assist using reacher. Pt reacher mod assist for standing balance during clothing management. Ambulated approx 5 feet with mod assist to sink to complete grooming tasks. Pt required min cues for organization of grooming tasks. Practiced standing balance for clothing management around  waist utilizing theraband to simulate waist band. Pt required min assist for balance with mod cues technique to alternate use of UEs for support of on sink and managing band. Pt left sitting in w/c at nurses station.   Therapy Documentation Precautions:  Precautions Precautions: Fall, Cervical, Back Precaution Comments: NPO with PEG Required Braces or Orthoses: Cervical Brace, Spinal Brace Cervical Brace: Hard collar Spinal Brace: Thoracolumbosacral orthotic, Applied in supine position Restrictions Weight Bearing Restrictions: No General: General OT Amount of Missed Time: 25 Minutes Vital Signs:   Pain: No report of pain during the therapy sessions.  See FIM for current functional status  Therapy/Group: Individual Therapy  [name], Melvinia Ashby N 07/28/2014, 12:08 PM

## 2014-07-29 ENCOUNTER — Inpatient Hospital Stay (HOSPITAL_COMMUNITY): Payer: Medicare Other

## 2014-07-29 ENCOUNTER — Inpatient Hospital Stay (HOSPITAL_COMMUNITY): Payer: Medicare Other | Admitting: *Deleted

## 2014-07-29 ENCOUNTER — Inpatient Hospital Stay (HOSPITAL_COMMUNITY): Payer: Medicare Other | Admitting: Speech Pathology

## 2014-07-29 NOTE — Progress Notes (Signed)
Physical Therapy Session Note  Patient Details  Name: Hunter MartesDwight Myers MRN: [id number] Date of Birth: 04/02/1946  Today's Date: 07/29/2014  PT Concurrent Time: 1530-1600 PT Individual Time: 1600-1630  PT Individual Time Calculation (min): 30 min  PT Concurrent Time Calculation (min): 30 min  Short Term Goals: Week 3:  PT Short Term Goal 1 (Week 3): Patient will perform bed mobility from flat bed surface without bed rails with supervision PT Short Term Goal 2 (Week 3): Patient will perform functional transfers with supervision 50% of the time PT Short Term Goal 3 (Week 3): Patietn will perform gait training x50' with LRAD and modA of 1 person PT Short Term Goal 4 (Week 3): Patient will negotiate 5 steps with B handrails and min A PT Short Term Goal 5 (Week 3): Patient will demonstrate carry over across sessions with mobility techniques with min cues  Skilled Therapeutic Interventions/Progress Updates:    Pt found sitting up in w/c at nurses station with soft waist belt in place. Session focused on cognitive remediation, dynamic sitting and standing balance, activity tolerance, and functional mobility, and gait training. Pt self-propelled w/c 100' using BLE nurse's station>rehab gym with supervision and min verbal cues for technique. Sitting balance challenged with activity sitting EOM on red air disk with feet supported to play connect 4, progressed to feet unsupported seated on red air disk and game placed out of reach anteriorly to promote reach outside BOS; pt was able to sustain>selective attention to task for two games. Pt engaged in standing activity to challenge standing balance and standing tolerance, pt performed boxing with min A, aiming for letters on punching bag to spell given words; pt was able to spell words up to 8 letters long with min verbal cues for assist with spelling more difficult words. Pt negotiated 5 steps B handrails and min A to steady patient and min verbal cues for  sequencing and foot placement. Pt performed gait in hospital hallway 140' with RW and min-mod A to steady patient, mod verbal cues provided for increased BOS, decreased R step length, decreased gait speed, weight shift to R, and increased hip and knee flexion on RLE. Pt returned to nurse's station sitting up in w/c with soft waist belt on.   Therapy Documentation Precautions:  Precautions Precautions: Fall, Cervical, Back Precaution Comments: NPO with PEG Required Braces or Orthoses: Cervical Brace, Spinal Brace Cervical Brace: Hard collar Spinal Brace: Thoracolumbosacral orthotic, Applied in supine position Restrictions Weight Bearing Restrictions: No Locomotion : Ambulation Ambulation/Gait Assistance: 4: Min assist;3: Mod assist Wheelchair Mobility Distance: 100   See FIM for current functional status  Therapy/Group: Individual Therapy  Jousha Schwandt 07/29/2014, 5:17 PM
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
## 2014-07-29 NOTE — Progress Notes (Addendum)
Occupational Therapy Session Note  Patient Details  Name: Hunter Myers MRN: [id number] Date of Birth: 08/13/1946  Today's Date: 07/29/2014 OT Individual Time: [id number] and 1401-1503  OT Individual Time Calculation (min): 60 min and 62 min    Short Term Goals: Week 3:  OT Short Term Goal 1 (Week 3): Pt will complete toilet task with mod assist OT Short Term Goal 2 (Week 3): Pt will consistently complete toilet transfers at supervision level and min cues OT Short Term Goal 3 (Week 3): Pt will be oriented to place, time, and situation with supervision cues OT Short Term Goal 4 (Week 3): Pt will complete LB dressing with mod assist and min cues for use of AE  Skilled Therapeutic Interventions/Progress Updates:    Session 1: Pt seen for ADL retraining with focus on standing balance, functional transfers, activity tolerance, and use of AE. Pt received sitting in w/c at nurses station. Completed oral care in standing with min cues and min-SBA for balance with LUE supported on sink. Transferred to bed with no cues for setup and supervision level. Completed bathing and dressing sitting EOB with mod cues for adherence to precautions and initiating use of AE. Pt required min assist for standing balance to manage clothing around waist. Pt transferred back to w/c with SBA and returned to nurses station.   Session 2: Pt seen for 1:1 OT session with focus on functional mobility, standing balance, and cognitive remediation. Pt received supine in bed requesting toilet. Ambulated to bathroom with mod assist and pt's RUE around therapist's shoulder. Pt required min assist for standing balance during clothing management and assist from therapist to manage clothing up d/t TLSO. Completed lunch with pt requiring supervision cues for safe swallowing. Pt verbalized 75% of safety swallowing strategies before beginning eating then requires min cues for carryover. Completed oral care in standing with CGA assist for  balance. Practiced sit<>stand 6x with pt initially requiring min assist then progressed to supervision 3x. Pt required min cues for technique and foot placement during sit<>stand. Pt required max cues for orientation to time of day as pt consistently getting night/day schedule confused during therapy. Pt left sitting at nurses station.  Therapy Documentation Precautions:  Precautions Precautions: Fall, Cervical, Back Precaution Comments: NPO with PEG Required Braces or Orthoses: Cervical Brace, Spinal Brace Cervical Brace: Hard collar Spinal Brace: Thoracolumbosacral orthotic, Applied in supine position Restrictions Weight Bearing Restrictions: No General:   Vital Signs: Therapy Vitals Pulse Rate: 62 BP: 129/70 mmHg Pain: Pain Assessment Pain Assessment: No/denies pain  See FIM for current functional status  Therapy/Group: Individual Therapy  Twyla Dais, Vara GuardianKayla N 07/29/2014, 12:00 PM

## 2014-07-29 NOTE — Progress Notes (Signed)
[facility] PHYSICAL MEDICINE & REHABILITATION     PROGRESS NOTE    Subjective/Complaints: Had a good night. Seems to be more redirectable. Denies new pain. ROS limited by mental status  Objective: Vital Signs: Blood pressure 125/68, pulse 58, temperature 98 F (36.7 C), temperature source Oral, resp. rate 18, height 5\' 10"  (1.778 m), weight 56.246 kg (124 lb), SpO2 99 %. No results found. No results for input(s): WBC, HGB, HCT, PLT in the last 72 hours.  Recent Labs  07/28/14 1210  NA 139  K 4.8  CL 101  GLUCOSE 121*  BUN 25*  CREATININE 0.62  CALCIUM 9.1   CBG (last 3)   Recent Labs  07/27/14 2050 07/28/14 0019 07/28/14 0353  GLUCAP 141* 137* 81    Wt Readings from Last 3 Encounters:  07/29/14 56.246 kg (124 lb)  07/09/14 58.514 kg (129 lb)    Physical Exam:   Constitutional: He appears well-developed.   Cervical collar in place HENT: Voice is hoarse but air leakage through trach, Head: Normocephalic and atraumatic.  Eyes: Conjunctivae are normal. Pupils are equal, round, and reactive to light.  Neck: air escaping from stoma--area clean. Cardiovascular: Regular rhythm.  Respiratory: Effort normal and breath sounds normal. No respiratory distress. He has no wheezes.  GI: Soft. Bowel sounds are normal. He exhibits no distension.  PEG site clean. Mild tenderness around the PEG site Musculoskeletal: He exhibits no edema.  Unable to raise left shoulder.  Neurological: He is alert.  Oriented to self. Lacks awareness and insight into current deficits. Less Restless. dysphonia persists.     3+ prox to 4/5 distally in UE's. LE: Left HF 3/5. RHF 2/5. 3 minus bilateral ankle dorsiflexors Skin: Skin is warm and dry. Mild erythema around the PICC site right medial arm Bilateral heels dry    Assessment/Plan: 1. 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 Physiatrist is providing close team supervision and 24 hour management of active medical problems listed below. Physiatrist and rehab team continue to assess barriers to discharge/monitor patient progress toward functional and medical goals. FIM: FIM - Bathing Bathing Steps Patient Completed: Chest, Right Arm, Left Arm, Abdomen, Front perineal area, Right upper leg, Left upper leg Bathing: 3: Mod-Patient completes 5-7 [id number] 10 parts or 50-74%  FIM - Upper Body Dressing/Undressing Upper body dressing/undressing steps patient completed: Thread/unthread right sleeve of pullover shirt/dresss, Thread/unthread left sleeve of pullover shirt/dress, Pull shirt over trunk Upper body dressing/undressing: 4: Min-Patient completed 75 plus % of tasks FIM - Lower Body Dressing/Undressing Lower body dressing/undressing steps patient completed: Pull pants up/down Lower body dressing/undressing: 2: Max-Patient completed 25-49% of tasks  FIM - Toileting Toileting steps completed by patient: Adjust clothing prior to toileting, Performs perineal hygiene Toileting Assistive Devices: Grab bar or rail for support Toileting: 3: Mod-Patient completed 2 of 3 steps  FIM - Diplomatic Services operational officerToilet Transfers Toilet Transfers Assistive Devices: Grab bars Toilet Transfers: 4-To toilet/BSC: Min A (steadying Pt. > 75%), 4-From toilet/BSC: Min A (steadying Pt. > 75%), 5-To toilet/BSC: Supervision (verbal cues/safety issues), 5-From toilet/BSC: Supervision (verbal cues/safety issues)  FIM - BankerBed/Chair Transfer Bed/Chair Transfer Assistive Devices: Arm rests, Bed rails Bed/Chair Transfer: 4: Bed > Chair or W/C: Min A (steadying Pt. > 75%), 4: Sit > Supine: Min A (steadying pt. > 75%/lift 1 leg), 4: Supine > Sit: Min A (steadying Pt. > 75%/lift 1 leg),  4: Chair or W/C > Bed: Min A (steadying Pt. > 75%)  FIM - Locomotion: Wheelchair Distance: 150 Locomotion: Wheelchair: 5:  Travels 150 ft or more: maneuvers on rugs and over door sills with supervision, cueing or coaxing FIM - Locomotion: Ambulation Locomotion: Ambulation Assistive Devices: Interior and spatial designerWalker - Eva Ambulation/Gait Assistance: 1: +2 Total assist, 3: Mod assist (+2 for Eva stabilization; modA for actual gait training) Locomotion: Ambulation: 1: Two helpers  Comprehension Comprehension Mode: Auditory Comprehension: 4-Understands basic 75 - 89% of the time/requires cueing 10 - 24% of the time  Expression Expression Mode: Verbal Expression: 5-Expresses basic needs/ideas: With extra time/assistive device  Social Interaction Social Interaction Mode: Asleep Social Interaction: 5-Interacts appropriately 90% of the time - Needs monitoring or encouragement for participation or interaction.  Problem Solving Problem Solving Mode: Asleep Problem Solving: 3-Solves basic 50 - 74% of the time/requires cueing 25 - 49% of the time  Memory Memory Mode: Asleep Memory: 3-Recognizes or recalls 50 - 74% of the time/requires cueing 25 - 49% of the time Medical Problem List and Plan: 1. Functional deficits secondary to TBI, cervical and thoracic spine fractures.   -Surgery [place] be in [place] for at least 3 months. Can be up to 30 degrees without TLSO  -cervical xr without fx but demineralization and diffuse facet disease  -thoracic films display fractures/l1 end plate fx  2. DVT Prophylaxis/Anticoagulation: Pharmaceutical: Lovenox 3. Pain Management: Will continue oxycodone bid with prn doses as needed. Lidocaine patch for neck pain  -gabapentin trial for feet 4. H/o anxiety disorder/Mood: Will continue xanax prn for now. Patient has poor insight and lacks awareness of deficits. LCSW to follow along for support and evaluation when indicated.  5. Neuropsych: This patient is not capable of making decisions on his own behalf.  -still requires restraints for safety---hopeful to remove soon  -low  bed  -seems to be doing better with seroquel hs---increased to 50 which helped further 6. Skin/Wound Care: Pressure relief measures. Continue air mattress overlay.  Eucerin cream for dry skin bilateral feet.  7. Fluids/Electrolytes/Nutrition: Monitor I/O. Electrolytes/bun/cr all look reasonable  -continue TF-on bolus schedule now   -feeding trials with speech now 8. ABLA:  hgb 11.5 9. Pulmonary: trach out. Stoma closed.   10.  Severe dysphagia, NPO continues  -?trials with SLP soon 11. Low am cbg's  LOS (Days) 20 A FACE TO FACE EVALUATION WAS PERFORMED  SWARTZ,ZACHARY T 07/29/2014 8:26 AM

## 2014-07-29 NOTE — Progress Notes (Signed)
Speech Language Pathology Daily Session Notes  Patient Details  Name: Hunter Myers MRN: [id number] Date of Birth: 04/12/1946  Today's Date: 07/29/2014  Session 1: SLP Individual Time: 0800-0900 SLP Individual Time Calculation (min): 60 min   Session 2: SLP GroupTime Calculation (min): 1100-1130 SLP Group Time Calculation: (min) 30 min  Short Term Goals: Week 3: SLP Short Term Goal 1 (Week 3): Patient will utilize an increased vocal intensity at the phrase level with Mod A multimodal cues.  SLP Short Term Goal 2 (Week 3): Patient will consume trials of Dys. 1 textures with nectar-thick liquids via tsp with minimal overt s/s of aspiration across 3 consecutive sessions with Mod A mutlimodal cues for utilization of swallowing compensatory strategies.   SLP Short Term Goal 3 (Week 3): Patient will peform pharyngeal strengthening exercises with Mod A multimodal cues.  SLP Short Term Goal 4 (Week 3): Patient will identify 2 physical and 2 cognitive deficits with Mod A multimodal cues.  SLP Short Term Goal 5 (Week 3): Patient will orient to time, place and situation with supervision multimodal cues.  SLP Short Term Goal 6 (Week 3): Patient will demonstrate sustained attention to a functional and familiar task for 15 minutes with Mod A multimodal cues cues.   Skilled Therapeutic Interventions:  Session 1: Skilled treatment session focused on addressing dysphagia and cognitive goals. Upon arrival, patient was sitting upright in his wheelchair and agreeable to participate in treatment session. SLP facilitated session by providing Min A multimodal cues for utilization of multiple swallows, alternating solids/liquids and small bites/sips during breakfast meal of Dys. 1 textures and nectar-thick liquids via teaspoon. Patient demonstrated a throat clear X 2 which was successful in expectorating mucous. Patient was oriented to place with Mod I and required Mod A multimodal cues for orientation to date and  place.  Patient continues to demonstrate an increased vocal intensity and was 100% intelligible at the sentence level. Patient was left in wheelchair at RN station. Continue with current plan of care.   Session 2: Skilled group treatment session focused on speech and cognitive goals. SLP facilitated session by providing Mod A multimodal cues for utilization of an increased vocal intensity at the sentence level while participating in a functional conversation with another patient. Patient was independently oriented to situation throughout the session but required Max A for orientation to date and place (city). Patient demonstrated appropriate social interaction with other group member and demonstrated appropriate selective attention for 30 minutes with Mod I. Patient left in wheelchair at RN station. Continue with current plan of care.   FIM:  Comprehension Comprehension Mode: Auditory Comprehension: 4-Understands basic 75 - 89% of the time/requires cueing 10 - 24% of the time Expression Expression Mode: Verbal Expression: 5-Expresses basic needs/ideas: With extra time/assistive device Social Interaction Social Interaction: 5-Interacts appropriately 90% of the time - Needs monitoring or encouragement for participation or interaction. Problem Solving Problem Solving: 3-Solves basic 50 - 74% of the time/requires cueing 25 - 49% of the time Memory Memory: 3-Recognizes or recalls 50 - 74% of the time/requires cueing 25 - 49% of the time FIM - Eating Eating Activity: 5: Supervision/cues  Pain Pain Assessment Pain Assessment: No/denies pain  Therapy/Group: Individual Therapy and Group Therapy  Cherese Lozano 07/29/2014, 10:52 AM

## 2014-07-29 NOTE — Progress Notes (Signed)
Recreational Therapy Session Note  Patient Details  Name: Dayton MartesDwight Riviera MRN: [id number] Date of Birth: 06/04/1946 Today's Date: 07/29/2014  Pain: no c/o Skilled Therapeutic Interventions/Progress Updates: Pt requesting toileting at beginning of session.  Pt performed stand pivot transfer with contact guard assist & toileting with min assist.  Pt needed assistance to adjust pants once pulled up after toileting. Leisure discussion with pt about splitting wood.  Pt maintained topic of conversation for ~15 minutes without redirection or verbal cuing for vocal intensity.  Therapy/Group: Individual Therapy   Marshall Roehrich 07/29/2014, 11:44 AM

## 2014-07-29 NOTE — Plan of Care (Signed)
Problem: RH BOWEL ELIMINATION Goal: RH STG MANAGE BOWEL WITH ASSISTANCE STG Manage Bowel with max Assistance.  Outcome: Progressing Goal: RH STG MANAGE BOWEL W/MEDICATION W/ASSISTANCE STG Manage Bowel with Medication with max Assistance.  Outcome: Progressing  Problem: RH BLADDER ELIMINATION Goal: RH STG MANAGE BLADDER WITH ASSISTANCE STG Manage Bladder With Min Assistance  Outcome: Progressing  Problem: RH SKIN INTEGRITY Goal: RH STG SKIN FREE OF INFECTION/BREAKDOWN Remain free form skin breakdown while on Rehab with Min assist  Outcome: Progressing Goal: RH STG MAINTAIN SKIN INTEGRITY WITH ASSISTANCE STG Maintain Skin Integrity With mod Assistance.  Outcome: Progressing Goal: RH STG ABLE TO PERFORM INCISION/WOUND CARE W/ASSISTANCE STG Able To Perform Incision/Wound Care With total Assistance.  Outcome: Progressing  Problem: RH SAFETY Goal: RH STG ADHERE TO SAFETY PRECAUTIONS W/ASSISTANCE/DEVICE STG Adhere to Safety Precautions With max Assistance/Device.  Outcome: Progressing Goal: RH STG DECREASED RISK OF FALL WITH ASSISTANCE STG Decreased Risk of Fall With max Assistance.  Outcome: Progressing  Problem: RH PAIN MANAGEMENT Goal: RH STG PAIN MANAGED AT OR BELOW PT'S PAIN GOAL Pain level less than 3  Outcome: Progressing

## 2014-07-29 NOTE — Progress Notes (Signed)
Social Work Patient ID: Hunter Myers, male   DOB: 06/06/1946, 68 y.o.   MRN: [id number]   Reviewed team conference with pt's daughter yesterday afternoon.  She is aware that I am continuing to pursue SNF. Daughter reports that HER son is now question if it would be better for pt to take him home.  I explained that, given what wife appears capable of handling at home, team would recommend 24/7 care for pt from someone other than wife.  Daughter feels that SNF is best plan still.  She asks if she and her son could observe therapies and talk with me further on Friday. Will plan to do so and better explain to pt's grandson his care needs.  At this point, d/c plan still SNF.  Will keep team posted.  Navy Rothschild, LCSW

## 2014-07-30 DIAGNOSIS — S069X3S Unspecified intracranial injury with loss of consciousness of 1 hour to 5 hours 59 minutes, sequela: Secondary | ICD-10-CM

## 2014-07-30 NOTE — Plan of Care (Signed)
Problem: RH BOWEL ELIMINATION Goal: RH STG MANAGE BOWEL W/MEDICATION W/ASSISTANCE STG Manage Bowel with Medication with Assistance. Mod I  Outcome: Progressing     

## 2014-07-30 NOTE — Progress Notes (Signed)
Patient ate 100% of breakfast this am. Patient reported feeling "full". 0800 tube feeding held. Assess % of lunch eaten and monitor intake. Continue with plan of care .  Cleotilde NeerJoyce, Tyresa Prindiville S

## 2014-07-30 NOTE — Plan of Care (Signed)
Problem: RH SAFETY Goal: RH STG ADHERE TO SAFETY PRECAUTIONS W/ASSISTANCE/DEVICE STG Adhere to Safety Precautions With Assistance/Device. Supervision  Outcome: Progressing     

## 2014-07-30 NOTE — Plan of Care (Signed)
Problem: RH BOWEL ELIMINATION Goal: RH STG MANAGE BOWEL WITH ASSISTANCE STG Manage Bowel with max Assistance.  Outcome: Progressing Goal: RH STG MANAGE BOWEL W/MEDICATION W/ASSISTANCE STG Manage Bowel with Medication with max Assistance.  Outcome: Progressing  Problem: RH BLADDER ELIMINATION Goal: RH STG MANAGE BLADDER WITH ASSISTANCE STG Manage Bladder With Min Assistance  Outcome: Progressing  Problem: RH SKIN INTEGRITY Goal: RH STG SKIN FREE OF INFECTION/BREAKDOWN Remain free form skin breakdown while on Rehab with Min assist  Outcome: Progressing Goal: RH STG MAINTAIN SKIN INTEGRITY WITH ASSISTANCE STG Maintain Skin Integrity With mod Assistance.  Outcome: Progressing Goal: RH STG ABLE TO PERFORM INCISION/WOUND CARE W/ASSISTANCE STG Able To Perform Incision/Wound Care With total Assistance.  Outcome: Progressing  Problem: RH SAFETY Goal: RH STG ADHERE TO SAFETY PRECAUTIONS W/ASSISTANCE/DEVICE STG Adhere to Safety Precautions With max Assistance/Device.  Outcome: Progressing Goal: RH STG DECREASED RISK OF FALL WITH ASSISTANCE STG Decreased Risk of Fall With max Assistance.  Outcome: Progressing  Problem: RH PAIN MANAGEMENT Goal: RH STG PAIN MANAGED AT OR BELOW PT'S PAIN GOAL Pain level less than 3  Outcome: Progressing     

## 2014-07-30 NOTE — Plan of Care (Signed)
Problem: RH BLADDER ELIMINATION Goal: RH STG MANAGE BLADDER WITH ASSISTANCE STG Manage Bladder With Assistance. Mod I  Outcome: Progressing     

## 2014-07-30 NOTE — Plan of Care (Signed)
Problem: RH PAIN MANAGEMENT Goal: RH STG PAIN MANAGED AT OR BELOW PT'S PAIN GOAL Pain level less than 3  Outcome: Progressing     

## 2014-07-30 NOTE — Progress Notes (Signed)
[facility] PHYSICAL MEDICINE & REHABILITATION     PROGRESS NOTE    Subjective/Complaints: Slept well. Non-agitated.  ROS limited by mental status  Objective: Vital Signs: Blood pressure 106/62, pulse 88, temperature 97.9 F (36.6 C), temperature source Oral, resp. rate 17, height 5\' 10"  (1.778 m), weight 52.617 kg (116 lb), SpO2 99 %. No results found. No results for input(s): WBC, HGB, HCT, PLT in the last 72 hours.  Recent Labs  07/28/14 1210  NA 139  K 4.8  CL 101  GLUCOSE 121*  BUN 25*  CREATININE 0.62  CALCIUM 9.1   CBG (last 3)   Recent Labs  07/27/14 2050 07/28/14 0019 07/28/14 0353  GLUCAP 141* 137* 81    Wt Readings from Last 3 Encounters:  07/30/14 52.617 kg (116 lb)  07/09/14 58.514 kg (129 lb)    Physical Exam:   Constitutional: He appears well-developed.   Cervical collar in place HENT: Voice is hoarse but air leakage through trach, Head: Normocephalic and atraumatic.  Eyes: Conjunctivae are normal. Pupils are equal, round, and reactive to light.  Neck: air escaping from stoma--area clean. Cardiovascular: Regular rhythm.  Respiratory: Effort normal and breath sounds normal. No respiratory distress. He has no wheezes.  GI: Soft. Bowel sounds are normal. He exhibits no distension.  PEG site clean. Mild tenderness around the PEG site Musculoskeletal: He exhibits no edema.  Unable to raise left shoulder.  Neurological: He is alert.  Oriented to self. Lacks awareness and insight into current deficits. Less Restless. dysphonia persists.     3+ prox to 4/5 distally in UE's. LE: Left HF 3/5. RHF 2/5. 3 minus bilateral ankle dorsiflexors Skin: Skin is warm and dry. Mild erythema around the PICC site right medial arm Bilateral heels dry    Assessment/Plan: 1. 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 Physiatrist is providing close team supervision and 24 hour management of active medical problems listed below. Physiatrist and rehab team continue to assess barriers to discharge/monitor patient progress toward functional and medical goals. FIM: FIM - Bathing Bathing Steps Patient Completed: Chest, Right Arm, Left Arm, Abdomen, Front perineal area, Right upper leg, Left upper leg, Left lower leg (including foot), Right lower leg (including foot), Buttocks Bathing: 4: Steadying assist  FIM - Upper Body Dressing/Undressing Upper body dressing/undressing steps patient completed: Thread/unthread right sleeve of pullover shirt/dresss, Thread/unthread left sleeve of pullover shirt/dress, Pull shirt over trunk Upper body dressing/undressing: 4: Min-Patient completed 75 plus % of tasks FIM - Lower Body Dressing/Undressing Lower body dressing/undressing steps patient completed: Thread/unthread left underwear leg, Pull underwear up/down, Thread/unthread left pants leg, Pull pants up/down, Don/Doff right sock, Don/Doff left sock, Don/Doff right shoe Lower body dressing/undressing: 3: Mod-Patient completed 50-74% of tasks  FIM - Toileting Toileting steps completed by patient: Adjust clothing prior to toileting, Performs perineal hygiene Toileting Assistive Devices: Grab bar or rail for support Toileting: 3: Mod-Patient completed 2 of 3 steps  FIM - Diplomatic Services operational officerToilet Transfers Toilet Transfers Assistive Devices: Grab bars Toilet Transfers: 4-To toilet/BSC: Min A (steadying Pt. > 75%), 4-From toilet/BSC: Min A (steadying Pt. > 75%)  FIM - Bed/Chair Transfer Bed/Chair Transfer Assistive Devices: Arm rests, Bed rails Bed/Chair Transfer: 4: Bed > Chair or W/C: Min A (steadying Pt. > 75%), 4: Chair or W/C > Bed: Min A (steadying Pt. > 75%)  FIM - Locomotion: Wheelchair Distance: 100  Locomotion: Wheelchair: 2: Travels 50 - 149 ft with supervision, cueing or coaxing FIM - Locomotion:  Ambulation Locomotion: Ambulation Assistive Devices: Designer, industrial/productWalker - Rolling Ambulation/Gait Assistance: 4: Min assist, 3: Mod assist Locomotion: Ambulation: 2: Travels 50 - 149 ft with moderate assistance (Pt: 50 - 74%)  Comprehension Comprehension Mode: Auditory Comprehension: 4-Understands basic 75 - 89% of the time/requires cueing 10 - 24% of the time  Expression Expression Mode: Verbal Expression: 5-Expresses basic needs/ideas: With extra time/assistive device  Social Interaction Social Interaction Mode: Asleep Social Interaction: 5-Interacts appropriately 90% of the time - Needs monitoring or encouragement for participation or interaction.  Problem Solving Problem Solving Mode: Asleep Problem Solving: 3-Solves basic 50 - 74% of the time/requires cueing 25 - 49% of the time  Memory Memory Mode: Asleep Memory: 3-Recognizes or recalls 50 - 74% of the time/requires cueing 25 - 49% of the time Medical Problem List and Plan: 1. Functional deficits secondary to TBI, cervical and thoracic spine fractures.   -Surgery [place] that [place] be in [place] for at least 3 months. Can be up to 30 degrees without TLSO  -cervical xr without fx but demineralization and diffuse facet disease  -thoracic films display fractures/l1 end plate fx  2. DVT Prophylaxis/Anticoagulation: Pharmaceutical: Lovenox 3. Pain Management: Will continue oxycodone bid with prn doses as needed. Lidocaine patch for neck pain  -gabapentin trial for feet 4. H/o anxiety disorder/Mood: Will continue xanax prn for now. Patient has poor insight and lacks awareness of deficits. LCSW to follow along for support and evaluation when indicated.  5. Neuropsych: This patient is not capable of making decisions on his own behalf.  -still requires restraints for safety---hopeful to remove soon  -low bed  -seems to be doing better with seroquel hs---increased to 50 which helped further 6. Skin/Wound Care: Pressure relief measures.  Continue air mattress overlay.  Eucerin cream for dry skin bilateral feet.  7. Fluids/Electrolytes/Nutrition: Monitor I/O. Electrolytes/bun/cr all look reasonable  -continue TF-on bolus schedule now --may stop to see how he sustains on oral diet  8. ABLA:  hgb 11.5 9. Pulmonary: trach out. Stoma closed.   10.  Severe dysphagia,   -D1 trials ---little intake so far   11. Low am cbg's  LOS (Days) 21 A FACE TO FACE EVALUATION WAS PERFORMED  Jaliza Seifried T 07/30/2014 8:10 AM

## 2014-07-30 NOTE — Plan of Care (Signed)
Problem: RH BOWEL ELIMINATION Goal: RH STG MANAGE BOWEL WITH ASSISTANCE STG Manage Bowel with Assistance. Mod I  Outcome: Progressing     

## 2014-07-30 NOTE — Plan of Care (Signed)
Problem: RH SKIN INTEGRITY Goal: RH STG SKIN FREE OF INFECTION/BREAKDOWN Remain free form skin breakdown while on Rehab with Min assist  Outcome: Progressing     

## 2014-07-31 ENCOUNTER — Inpatient Hospital Stay (HOSPITAL_COMMUNITY): Payer: Medicare Other

## 2014-07-31 ENCOUNTER — Inpatient Hospital Stay (HOSPITAL_COMMUNITY): Payer: Medicare Other | Admitting: Speech Pathology

## 2014-07-31 ENCOUNTER — Inpatient Hospital Stay (HOSPITAL_COMMUNITY): Payer: Medicare Other | Admitting: *Deleted

## 2014-07-31 MED ORDER — FREE WATER
300.0000 mL | Freq: Four times a day (QID) | Status: DC
  Administered 2014-07-31 – 2014-08-01 (×7): 300 mL

## 2014-07-31 MED ORDER — JEVITY 1.5 CAL/FIBER PO LIQD
325.0000 mL | ORAL | Status: DC
  Administered 2014-07-31: 325 mL

## 2014-07-31 NOTE — Progress Notes (Signed)
Speech Language Pathology Weekly Progress and Session Note  Patient Details  Name: Hunter Myers MRN: [id number] Date of Birth: Jun 05, 1946  Beginning of progress report period: July 24, 2014 End of progress report period: July 31, 2014  Today's Date: 07/31/2014 SLP Individual Time: 0900-1000 SLP Individual Time Calculation (min): 60 min  Short Term Goals: Week 3: SLP Short Term Goal 1 (Week 3): Patient will utilize an increased vocal intensity at the phrase level with Mod A multimodal cues.  SLP Short Term Goal 1 - Progress (Week 3): Met SLP Short Term Goal 2 (Week 3): Patient will consume trials of Dys. 1 textures with nectar-thick liquids via tsp with minimal overt s/s of aspiration across 3 consecutive sessions with Mod A mutlimodal cues for utilization of swallowing compensatory strategies.   SLP Short Term Goal 2 - Progress (Week 3): Met SLP Short Term Goal 3 (Week 3): Patient will peform pharyngeal strengthening exercises with Mod A multimodal cues.  SLP Short Term Goal 3 - Progress (Week 3): Discontinued (comment) SLP Short Term Goal 4 (Week 3): Patient will identify 2 physical and 2 cognitive deficits with Mod A multimodal cues.  SLP Short Term Goal 4 - Progress (Week 3): Met SLP Short Term Goal 5 (Week 3): Patient will orient to time, place and situation with supervision multimodal cues.  SLP Short Term Goal 5 - Progress (Week 3): Not met SLP Short Term Goal 6 (Week 3): Patient will demonstrate sustained attention to a functional and familiar task for 15 minutes with Mod A multimodal cues cues.  SLP Short Term Goal 6 - Progress (Week 3): Met    New Short Term Goals: Week 4: SLP Short Term Goal 1 (Week 4): Patient will utilize an increased vocal intensity at the phrase level with Min A multimodal cues.  SLP Short Term Goal 2 (Week 4): Patient will consume Dys. 1 textures with nectar-thick liquids via tsp with minimal overt s/s of aspiration across 3 consecutive sessions  with Min A mutlimodal cues for utilization of swallowing compensatory strategies.   SLP Short Term Goal 3 (Week 4): Patient will identify 2 physical and 2 cognitive deficits with Min A multimodal cues.  SLP Short Term Goal 4 (Week 4): Patient will orient to time, place and situation with supervision multimodal cues.  SLP Short Term Goal 5 (Week 4): Patient will demonstrate sustained attention to a functional and familiar task for 15 minutes with Min A multimodal cues cues.   Weekly Progress Updates: Patient has made functional gains and has met 4 of 6 STG's this reporting period due to increased attention, intellectual awareness, utilization of an increased vocal intensity and swallowing function.  Patient is currently consuming Dys. 1 textures with nectar-thick liquids via tsp with minimal overt s/s of aspiration and Mod A multimodal cues for utilization of swallowing compensatory strategies. 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 Patient and family education is ongoing, however, patient's family is unable to provide the necessary physical and cognitive assistance needed at this time, therefore, patient's d/c plan is now to a SNF. Patient would benefit from continued skilled SLP intervention to maximize cognitive-linguistic abilities and swallow function in order to  maximize his overall independence prior to discharge.   Intensity: Minumum of 1-2 x/day, 30 to 90 minutes Frequency: 5 out of 7 days Duration/Length of Stay: TBD due to SNF placement  Treatment/Interventions: Cognitive  remediation/compensation;Cueing hierarchy;Dysphagia/aspiration precaution training;Environmental controls;Functional tasks;Internal/external aids;Patient/family education;Speech/Language facilitation;Therapeutic Activities   Daily Session  Skilled Therapeutic Interventions: Skilled treatment session focused on addressing dysphagia and cognitive goals. Upon arrival, patient was sitting upright in his wheelchair and agreeable to participate in treatment session. SLP facilitated session by initially providing Mod A multimodal cues which faded to Min A by end of meal for utilization of multiple swallows, alternating solids/liquids and small bites/sips during breakfast meal of Dys. 1 textures and nectar-thick liquids via teaspoon. Patient demonstrated a wet vocal quality X 2 that he was able to spontaneously clear his throat X 2 and was successful in expectorating mucous. Patient was oriented to place, time and situation with extra time and supervision question cues.  Patient continues to demonstrate an increased vocal intensity and was 100% intelligible at the sentence level. Patient was left in wheelchair at RN station. Continue with current plan of care.  FIM:  Comprehension Comprehension Mode: Auditory Comprehension: 4-Understands basic 75 - 89% of the time/requires cueing 10 - 24% of the time Expression Expression Mode: Verbal Expression: 5-Expresses basic needs/ideas: With extra time/assistive device Social Interaction Social Interaction: 5-Interacts appropriately 90% of the time - Needs monitoring or encouragement for participation or interaction. Problem Solving Problem Solving: 3-Solves basic 50 - 74% of the time/requires cueing 25 - 49% of the time Memory Memory: 3-Recognizes or recalls 50 - 74% of the time/requires cueing 25 - 49% of the time FIM - Eating Eating Activity: 5: Supervision/cues Pain Pain Assessment Pain Assessment: No/denies pain Pain Score: 0-No pain  Therapy/Group:  Individual Therapy  Janella Rogala 07/31/2014, 12:05 PM  [place]on Anna, [place], [place]

## 2014-07-31 NOTE — Progress Notes (Signed)
Physical Therapy Weekly Progress Note  Patient Details  Name: Hunter Myers MRN: [id number] Date of Birth: 08-04-1946  Beginning of progress report period: July 24, 2014 End of progress report period: July 31, 2014  Today's Date: 07/31/2014 PT Individual Time: 1100-1200 PT Individual Time Calculation (min): 60 min   Patient has met 5 of 5 short term goals.  Patient has made good progress during this reporting period on rehab. Patient is currently functioning at close supervision-min guard level for squat pivot transfers and min-modA for stand pivot transfers and gait training x75-140' with RW, and negotiation of 5 steps with B handrails. 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 Patient continues to require maxA multimodal cues for adherence to precautions and for overall safety secondary to impulsivity and poor awareness. Patient and family education is ongoing, however, patient's family is unable to provide the necessary physical and cognitive assistance needed at this time, therefore, patient's d/c plan is now to a SNF.  Patient continues to demonstrate the following deficits:  poor balance and balance strategies, decreased postural control, decreased ability to compensate for deficits, sustained attention, decreased awareness, impulsivity, activity tolerance, decreased functional use of B LE and therefore will continue to benefit from skilled PT intervention to enhance overall performance with activity tolerance, balance, postural control, ability to compensate for deficits, functional use of  right lower extremity, attention, awareness, coordination and knowledge of precautions.  Patient progressing toward long term goals..  Continue plan of care.  PT Short Term  Goals Week 1:  PT Short Term Goal 1 (Week 1): Patient will perform bed mobility with use of hospital bed functions and modA. PT Short Term Goal 1 - Progress (Week 1): Met PT Short Term Goal 2 (Week 1): Patient will perform functional transfers with maxA x1. PT Short Term Goal 2 - Progress (Week 1): Met PT Short Term Goal 3 (Week 1): Patient will perform gait training 66' with +2 assist. PT Short Term Goal 3 - Progress (Week 1): Met PT Short Term Goal 4 (Week 1): Patient will negotiate 2 steps with B handrails and +2 assist. PT Short Term Goal 4 - Progress (Week 1): Met PT Short Term Goal 5 (Week 1): Patient will demonstrate carry over within session with mobility techniques with mod cues. PT Short Term Goal 5 - Progress (Week 1): Progressing toward goal Week 2:  PT Short Term Goal 1 (Week 2): Patient will perform bed mobility from flat surface without bedrails and minA. PT Short Term Goal 1 - Progress (Week 2): Met PT Short Term Goal 2 (Week 2): Patient will perform functional transfers with modA consistently. PT Short Term Goal 2 - Progress (Week 2): Met PT Short Term Goal 3 (Week 2): Patient will perform gait training x50' with LRAD and Hugo. PT Short Term Goal 3 - Progress (Week 2): Progressing toward goal PT Short Term Goal 4 (Week 2): Patient will negotiate 5 steps with B handrails and modA. PT Short Term Goal 4 - Progress (Week 2): Met PT Short Term Goal 5 (Week 2): Patient will demonstrate carry over within session with mobility techniques with mod cues. PT Short Term Goal 5 - Progress (Week 2): Met Week 3:  PT Short Term Goal 1 (Week 3): Patient will perform bed mobility from flat bed surface without bed rails with  supervision PT Short Term Goal 1 - Progress (Week 3): Met PT Short Term Goal 2 (Week 3): Patient will perform functional transfers with supervision 50% of the time PT Short Term Goal 2 - Progress (Week 3): Met PT Short Term Goal 3 (Week 3): Patietn will perform gait  training x50' with LRAD and modA of 1 person PT Short Term Goal 3 - Progress (Week 3): Met PT Short Term Goal 4 (Week 3): Patient will negotiate 5 steps with B handrails and min A PT Short Term Goal 4 - Progress (Week 3): Met PT Short Term Goal 5 (Week 3): Patient will demonstrate carry over across sessions with mobility techniques with min cues PT Short Term Goal 5 - Progress (Week 3): Met Week 4:  PT Short Term Goal 1 (Week 4): STGs=LTGs  Skilled Therapeutic Interventions/Progress Updates:    Patient received sitting in wheelchair. Session focused on functional transfers, gait training, stair negotiation, and wheelchair mobility. R toe off AFO already donned upon receiving patient. Patient performed wheelchair mobility 120' with B LEs only and supervision, demonstrating improved ability to extend R knee and clear R foot. Gait training in controlled environment 7' x2 with RW and min-modA, modA required with instances of poor R foot clearance and subsequent anterior LOB. Block practice squat pivot transfers min guard progressing to close supervision, progressed to transfer set up and wheelchair parts management facilitated by patient, requiring min cues and visual demonstration initially, progressing to supervision for accuracy and no cues later. Patient left sitting in wheelchair with seatbelt donned at RN station.  Therapy Documentation Precautions:  Precautions Precautions: Fall, Cervical, Back Precaution Comments: PEG Required Braces or Orthoses: Cervical Brace, Spinal Brace Cervical Brace: Hard collar Spinal Brace: Thoracolumbosacral orthotic, Applied in supine position Restrictions Weight Bearing Restrictions: No Pain: Pain Assessment Pain Assessment: No/denies pain Pain Score: 0-No pain Locomotion : Ambulation Ambulation/Gait Assistance: 4: Min assist;3: Mod assist Wheelchair Mobility Distance: 120   See FIM for current functional status  Therapy/Group: Individual  Therapy  Lillia Abed. Haadi Santellan, PT, DPT 07/31/2014, 11:41 AM

## 2014-07-31 NOTE — Progress Notes (Signed)
Occupational Therapy Weekly Progress Note  Patient Details  Name: Hunter Myers MRN: [id number] Date of Birth: 07-21-1946  Beginning of progress report period: July 24, 2014 End of progress report period: July 31, 2014  Today's Date: 07/31/2014 OT Individual Time: 0800-0900 OT Individual Time Calculation (min): 60 min    Patient has met 3 of 4 short term goals.  Patient is making good, slow progress during therapy. Patient currently requires min assist overall for squat pivot transfers and min cues for setup and safety. Patient has progressed to consistently requiring min-mod assist standing balance during LB self-care with min cues for alternating UE support for balance. Patient demonstrates improved attention during self-care tasks, requiring min-mod cues for selective attention, and mod cues for sustained attention during functional tasks in uncontrolled environment. Patient greatest barriers at this time are decreased awareness, poor postural control in standing, and decreased attention. 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  Patient progressing toward long term goals..  Continue plan of care.  OT Short Term Goals Week 3:  OT Short Term Goal 1 (Week 3): Pt will complete toilet task with mod assist OT Short Term Goal 1 - Progress (Week 3): Met OT Short Term Goal 2 (Week 3): Pt will consistently complete toilet transfers at  supervision level and min cues OT Short Term Goal 2 - Progress (Week 3): Progressing toward goal OT Short Term Goal 3 (Week 3): Pt will be oriented to place, time, and situation with supervision cues OT Short Term Goal 3 - Progress (Week 3): Met OT Short Term Goal 4 (Week 3): Pt will complete LB dressing with mod assist and min cues for use of AE OT Short Term Goal 4 - Progress (Week 3): Met Week 4:  OT Short Term Goal 1 (Week 4): Focus on LTGs  Skilled Therapeutic Interventions/Progress Updates:    Pt seen for ADL retraining with focus on functional mobility, postural control in standing, adherence to precautions, and activity tolerance. Pt received supine in bed requesting to toilet. Ambulated bed>bathroom with RUE over therapist's shoulder and min-mod assist. Pt required min assist for standing balance during clothing management and completed hygiene in sitting. Returned to bed to complete bathing and dressing. Pt required min cues for problem solving for use of AE as he was aware of back precautions during LB self-care. Pt required steadying assist for hygiene and clothing management in standing. Pt completed squat pivot transfer bed>w/c with CGA and no cues for setup. Pt recalled day of the week and holiday yesterday without cues. Pt handed off to SLP.   Therapy Documentation Precautions:  Precautions Precautions: Fall, Cervical, Back Precaution Comments: NPO with PEG Required Braces or Orthoses: Cervical Brace, Spinal Brace Cervical Brace: Hard collar Spinal Brace: Thoracolumbosacral orthotic, Applied in supine position Restrictions Weight Bearing Restrictions: No General:   Vital Signs: Therapy Vitals Temp: 97.9 F (36.6 C) Temp Source: Oral Pulse Rate: 78 Resp: 18 BP: (!) 103/58 mmHg Patient Position (if appropriate): Lying Oxygen Therapy SpO2: 100 % O2 Device: Not Delivered Pain: No report of pain during session.   See FIM for current  functional status  Therapy/Group:  Individual Therapy  Duayne Cal 07/31/2014, 6:43 AM

## 2014-07-31 NOTE — Progress Notes (Signed)
Physical Therapy Note  Patient Details  Name: Hunter MartesDwight Myers MRN: [id number] Date of Birth: 11/13/1945 Today's Date: 07/31/2014  Patient missed 30 minutes of skilled physical therapy this PM secondary to refusal due to fatigue. Patient had been returned to bed upon therapist entering room. Patient reporting he is "too tired." Will follow up as able.  Zella RicherBridget S Simmone [place] [name], PT, DPT 07/31/2014, 4:43 PM

## 2014-07-31 NOTE — Progress Notes (Signed)
Speech Language Pathology Daily Session Note  Patient Details  Name: Hunter MartesDwight Myers MRN: [id number] Date of Birth: 04/29/1946  Today's Date: 07/31/2014 SLP Individual Time: 1430-1530 SLP Individual Time Calculation (min): 60 min  Short Term Goals: Week 4: SLP Short Term Goal 1 (Week 4): Patient will utilize an increased vocal intensity at the phrase level with Min A multimodal cues.  SLP Short Term Goal 2 (Week 4): Patient will consume Dys. 1 textures with nectar-thick liquids via tsp with minimal overt s/s of aspiration across 3 consecutive sessions with Min A mutlimodal cues for utilization of swallowing compensatory strategies.   SLP Short Term Goal 3 (Week 4): Patient will identify 2 physical and 2 cognitive deficits with Min A multimodal cues.  SLP Short Term Goal 4 (Week 4): Patient will orient to time, place and situation with supervision multimodal cues.  SLP Short Term Goal 5 (Week 4): Patient will demonstrate sustained attention to a functional and familiar task for 15 minutes with Min A multimodal cues cues.   Skilled Therapeutic Interventions:  Pt was seen for skilled ST targeting cognitive goals.  Upon arrival, pt was seated upright in wheelchair with family present at bedside.  Pt was awake, alert, and agreeable to participate in ST.  SLP facilitated the session with a structured new learning task targeting use of associations as a compensatory aid for delayed recall of information.  Pt required overall max faded to mod assist to complete the abovementioned task for ~75% accuracy due to decreased storage of new information.  As task progressed, pt also required increased cuing for generative naming due to increased demand on mental flexibility.  Upon completion of task, SLP returned pt to room and transferred pt back to bed with assistance from RN for removal of back brace.  Pt is making good progress towards meeting goals.  Continue per current plan of care.    FIM:   Comprehension Comprehension Mode: Auditory Comprehension: 4-Understands basic 75 - 89% of the time/requires cueing 10 - 24% of the time Expression Expression Mode: Verbal Expression: 5-Expresses basic needs/ideas: With extra time/assistive device Social Interaction Social Interaction: 5-Interacts appropriately 90% of the time - Needs monitoring or encouragement for participation or interaction. Problem Solving Problem Solving: 3-Solves basic 50 - 74% of the time/requires cueing 25 - 49% of the time Memory Memory: 3-Recognizes or recalls 50 - 74% of the time/requires cueing 25 - 49% of the time  Pain Pain Assessment Pain Assessment: No/denies pain  Therapy/Group: Individual Therapy   Jackalyn LombardNicole Yuki Purves, M.A. CCC-SLP  Dow Blahnik, Melanee SpryNicole L 07/31/2014, 4:14 PM

## 2014-07-31 NOTE — Progress Notes (Signed)
[name]IC[facility] & RE[facility]ABILITATION     PROGRESS NOTE    Subjective/Complaints: Fairly good night. Still impulsive at times.   ROS limited by mental status  Objective: Vital Signs: Blood pressure 103/58, pulse 78, temperature 97.9 F (36.6 C), temperature source Oral, resp. rate 18, height 5\' 10"  (1.778 m), weight 53.524 kg (118 lb), SpO2 100 %. No results found. No results for input(s): WBC, HGB, HCT, PLT in the last 72 hours.  Recent Labs  07/28/14 1210  NA 139  K 4.8  CL 101  GLUCOSE 121*  BUN 25*  CREATININE 0.62  CALCIUM 9.1   CBG (last 3)  No results for input(s): GLUCAP in the last 72 hours.  Wt Readings from Last 3 Encounters:  07/31/14 53.524 kg (118 lb)  07/09/14 58.514 kg (129 lb)    Physical Exam:   Constitutional: He appears well-developed.   Cervical collar in place HENT: Voice is hoarse but air leakage through trach, Head: Normocephalic and atraumatic.  Eyes: Conjunctivae are normal. Pupils are equal, round, and reactive to light.  Neck: air escaping from stoma--area clean. Cardiovascular: Regular rhythm.  Respiratory: Effort normal and breath sounds normal. No respiratory distress. He has no wheezes.  GI: Soft. Bowel sounds are normal. He exhibits no distension.  PEG site clean. Mild tenderness around the PEG site Musculoskeletal: He exhibits no edema.  Unable to raise left shoulder.  Neurological: He is alert.  Oriented to self. Lacks awareness and insight into current deficits. Less Restless. dysphonia persists.     3+ prox to 4/5 distally in UE's. LE: Left HF 3/5. RHF 2/5. 3 minus bilateral ankle dorsiflexors Skin: Skin is warm and dry. Mild erythema around the PICC site right medial arm Bilateral heels dry    Assessment/Plan: 1. 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 Physiatrist is providing close team supervision and 24 hour management of active medical problems listed below. Physiatrist and rehab team continue to assess barriers to discharge/monitor patient progress toward functional and medical goals. FIM: FIM - Bathing Bathing Steps Patient Completed: Chest, Right Arm, Left Arm, Abdomen, Front perineal area, Right upper leg, Left upper leg, Left lower leg (including foot), Right lower leg (including foot), Buttocks Bathing: 4: Steadying assist  FIM - Upper Body Dressing/Undressing Upper body dressing/undressing steps patient completed: Thread/unthread right sleeve of pullover shirt/dresss, Thread/unthread left sleeve of pullover shirt/dress, Pull shirt over trunk Upper body dressing/undressing: 4: Min-Patient completed 75 plus % of tasks FIM - Lower Body Dressing/Undressing Lower body dressing/undressing steps patient completed: Thread/unthread left underwear leg, Pull underwear up/down, Thread/unthread left pants leg, Pull pants up/down, Don/Doff right sock, Don/Doff left sock, Don/Doff right shoe Lower body dressing/undressing: 3: Mod-Patient completed 50-74% of tasks  FIM - Toileting Toileting steps completed by patient: Adjust clothing prior to toileting, Performs perineal hygiene Toileting Assistive Devices: Grab bar or rail for support Toileting: 3: Mod-Patient completed 2 of 3 steps  FIM - Diplomatic Services operational officerToilet Transfers Toilet Transfers Assistive Devices: Grab bars Toilet Transfers: 4-To toilet/BSC: Min A (steadying Pt. > 75%), 4-From toilet/BSC: Min A (steadying Pt. > 75%)  FIM - Bed/Chair Transfer Bed/Chair Transfer Assistive Devices: Arm rests, Bed rails Bed/Chair Transfer: 4: Bed > Chair or W/C: Min A (steadying Pt. > 75%), 4: Chair or W/C > Bed: Min A (steadying Pt. > 75%)  FIM - Locomotion: Wheelchair Distance: 100 Locomotion: Wheelchair:  2: Travels 50 - 149 ft with supervision, cueing or coaxing FIM - Locomotion:  Ambulation Locomotion: Ambulation Assistive Devices: Walker - Rolling Ambulation/Gait Assistance: 4: Min assist, 3: Mod assist Locomotion: Ambulation: 2: Travels 50 - 149 ft with moderate assistance (Pt: 50 - 74%)  Comprehension Comprehension Mode: Auditory Comprehension: 4-Understands basic 75 - 89% of the time/requires cueing 10 - 24% of the time  Expression Expression Mode: Verbal Expression: 5-Expresses basic needs/ideas: With extra time/assistive device  Social Interaction Social Interaction Mode: Asleep Social Interaction: 5-Interacts appropriately 90% of the time - Needs monitoring or encouragement for participation or interaction.  Problem Solving Problem Solving Mode: Asleep Problem Solving: 3-Solves basic 50 - 74% of the time/requires cueing 25 - 49% of the time  Memory Memory Mode: Asleep Memory: 3-Recognizes or recalls 50 - 74% of the time/requires cueing 25 - 49% of the time Medical Problem List and Plan: 1. Functional deficits secondary to TBI, cervical and thoracic spine fractures.   -Surgery [place] that [place] be in [place] and [place] for at least 3 months. Can be up to 30 degrees without TLSO  -cervical xr without fx but demineralization and diffuse facet disease  -thoracic films display fractures/l1 end plate fx  2. DVT Prophylaxis/Anticoagulation: Pharmaceutical: Lovenox 3. Pain Management: Will continue oxycodone bid with prn doses as needed. Lidocaine patch for neck pain  -gabapentin trial for feet 4. H/o anxiety disorder/Mood: Will continue xanax prn for now. Patient has poor insight and lacks awareness of deficits. LCSW to follow along for support and evaluation when indicated.  5. Neuropsych: This patient is not capable of making decisions on his own behalf.  -still requires restraints for safety---hopeful to remove soon  -low bed  -seems to be doing better with seroquel hs---increased to 50 which helped further 6. Skin/Wound Care: Pressure relief measures.  Continue air mattress overlay.  Eucerin cream for dry skin bilateral feet.  7. Fluids/Electrolytes/Nutrition: Monitor I/O. Electrolytes/bun/cr all look reasonable  -will back off bolus feeds given his good po intake currently  -continue same flushes  8. ABLA:  hgb 11.5 9. Pulmonary: trach out. Stoma closed.   10.  Severe dysphagia,   -D1 diet-- as above  11. Low am cbg's  LOS (Days) 22 A FACE TO FACE EVALUATION WAS PERFORMED  SWARTZ,ZACHARY T 07/31/2014 8:28 AM

## 2014-07-31 NOTE — Plan of Care (Signed)
Problem: RH BOWEL ELIMINATION Goal: RH STG MANAGE BOWEL WITH ASSISTANCE STG Manage Bowel with Assistance. Mod I  Outcome: Progressing Goal: RH STG MANAGE BOWEL W/MEDICATION W/ASSISTANCE STG Manage Bowel with Medication with Assistance. Mod I  Outcome: Progressing  Problem: RH BLADDER ELIMINATION Goal: RH STG MANAGE BLADDER WITH ASSISTANCE STG Manage Bladder With Assistance. Mod I  Outcome: Progressing  Problem: RH SKIN INTEGRITY Goal: RH STG SKIN FREE OF INFECTION/BREAKDOWN Remain free form skin breakdown while on Rehab with Min assist  Outcome: Progressing Goal: RH STG MAINTAIN SKIN INTEGRITY WITH ASSISTANCE STG Maintain Skin Integrity With mod Assistance.  Outcome: Progressing Goal: RH STG ABLE TO PERFORM INCISION/WOUND CARE W/ASSISTANCE STG Able To Perform Incision/Wound Care With total Assistance.  Outcome: Progressing  Problem: RH SAFETY Goal: RH STG ADHERE TO SAFETY PRECAUTIONS W/ASSISTANCE/DEVICE STG Adhere to Safety Precautions With Assistance/Device. Supervision  Outcome: Progressing Goal: RH STG DECREASED RISK OF FALL WITH ASSISTANCE STG Decreased Risk of Fall With max Assistance.  Outcome: Progressing  Problem: RH PAIN MANAGEMENT Goal: RH STG PAIN MANAGED AT OR BELOW PT'S PAIN GOAL Pain level less than 3  Outcome: Progressing

## 2014-08-01 ENCOUNTER — Inpatient Hospital Stay (HOSPITAL_COMMUNITY): Payer: Medicare Other | Admitting: *Deleted

## 2014-08-01 ENCOUNTER — Inpatient Hospital Stay (HOSPITAL_COMMUNITY): Payer: Medicare Other | Admitting: Occupational Therapy

## 2014-08-01 ENCOUNTER — Ambulatory Visit (HOSPITAL_COMMUNITY): Payer: Medicare Other | Admitting: Speech Pathology

## 2014-08-01 ENCOUNTER — Encounter (HOSPITAL_COMMUNITY): Payer: Medicare Other

## 2014-08-01 NOTE — Progress Notes (Signed)
[facility] MEDIC[facility] & RE[facility]ABILITATION     PROGRESS NOTE    Subjective/Complaints: No new issues. .   ROS limited by mental status  Objective: Vital Signs: Blood pressure 109/72, pulse 89, temperature 98.2 F (36.8 C), temperature source Oral, resp. rate 18, height 5\' 10"  (1.778 m), weight 57.153 kg (126 lb), SpO2 97 %. No results found. No results for input(s): WBC, HGB, HCT, PLT in the last 72 hours. No results for input(s): NA, K, CL, GLUCOSE, BUN, CREATININE, CALCIUM in the last 72 hours.  Invalid input(s): CO CBG (last 3)  No results for input(s): GLUCAP in the last 72 hours.  Wt Readings from Last 3 Encounters:  08/01/14 57.153 kg (126 lb)  07/09/14 58.514 kg (129 lb)    Physical Exam:   Constitutional: He appears well-developed.   Cervical collar in place HENT: Voice is hoarse but air leakage through trach, Head: Normocephalic and atraumatic.  Eyes: Conjunctivae are normal. Pupils are equal, round, and reactive to light.  Neck: air escaping from stoma--area clean. Cardiovascular: Regular rhythm.  Respiratory: Effort normal and breath sounds normal. No respiratory distress. He has no wheezes.  GI: Soft. Bowel sounds are normal. He exhibits no distension.  PEG site clean. Mild tenderness around the PEG site Musculoskeletal: He exhibits no edema.  Unable to raise left shoulder.  Neurological: He is alert.  Oriented to self. Lacks awareness and insight into current deficits. Less Restless. dysphonia persists.     3+ prox to 4/5 distally in UE's. LE: Left HF 3/5. RHF 2/5. 3 minus bilateral ankle dorsiflexors Skin: Skin is warm and dry. Mild erythema around the PICC site right medial arm Bilateral heels dry    Assessment/Plan: 1. 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 Physiatrist is  providing close team supervision and 24 hour management of active medical problems listed below. Physiatrist and rehab team continue to assess barriers to discharge/monitor patient progress toward functional and medical goals. FIM: FIM - Bathing Bathing Steps Patient Completed: Chest, Right Arm, Left Arm, Abdomen, Front perineal area, Right upper leg, Left upper leg, Left lower leg (including foot), Right lower leg (including foot), Buttocks Bathing: 4: Steadying assist  FIM - Upper Body Dressing/Undressing Upper body dressing/undressing steps patient completed: Thread/unthread right sleeve of pullover shirt/dresss, Thread/unthread left sleeve of pullover shirt/dress, Pull shirt over trunk Upper body dressing/undressing: 4: Min-Patient completed 75 plus % of tasks FIM - Lower Body Dressing/Undressing Lower body dressing/undressing steps patient completed: Thread/unthread left underwear leg, Thread/unthread left pants leg, Pull pants up/down, Don/Doff right sock, Don/Doff left sock, Thread/unthread right underwear leg, Thread/unthread right pants leg Lower body dressing/undressing: 3: Mod-Patient completed 50-74% of tasks  FIM - Toileting Toileting steps completed by patient: Adjust clothing prior to toileting, Performs perineal hygiene Toileting Assistive Devices: Grab bar or rail for support Toileting: 3: Mod-Patient completed 2 of 3 steps  FIM - Diplomatic Services operational officerToilet Transfers Toilet Transfers Assistive Devices: Grab bars Toilet Transfers: 4-To toilet/BSC: Min A (steadying Pt. > 75%), 4-From toilet/BSC: Min A (steadying Pt. > 75%)  FIM - BankerBed/Chair Transfer Bed/Chair Transfer Assistive Devices: Arm rests, Therapist, occupationalWalker Bed/Chair Transfer: 4: Bed > Chair or W/C: Min A (steadying Pt. > 75%), 4: Chair or W/C > Bed: Min A (steadying Pt. > 75%)  FIM - Locomotion: Wheelchair Distance: 120 Locomotion: Wheelchair: 2: Travels 50 - 149 ft with supervision, cueing or coaxing  FIM - Locomotion: Ambulation Locomotion:  Ambulation Assistive Devices: Designer, industrial/productWalker - Rolling Ambulation/Gait Assistance: 4: Min assist, 3: Mod assist Locomotion: Ambulation: 2: Travels 50 - 149 ft with moderate assistance (Pt: 50 - 74%)  Comprehension Comprehension Mode: Auditory Comprehension: 4-Understands basic 75 - 89% of the time/requires cueing 10 - 24% of the time  Expression Expression Mode: Verbal Expression: 5-Expresses basic needs/ideas: With extra time/assistive device  Social Interaction Social Interaction Mode: Asleep Social Interaction: 5-Interacts appropriately 90% of the time - Needs monitoring or encouragement for participation or interaction.  Problem Solving Problem Solving Mode: Asleep Problem Solving: 3-Solves basic 50 - 74% of the time/requires cueing 25 - 49% of the time  Memory Memory Mode: Asleep Memory: 3-Recognizes or recalls 50 - 74% of the time/requires cueing 25 - 49% of the time Medical Problem List and Plan: 1. Functional deficits secondary to TBI, cervical and thoracic spine fractures.   -Surgery requests that pt be in [place] and [place] for at least 3 months. Can be up to 30 degrees without TLSO  -cervical xr without fx but demineralization and diffuse facet disease  -thoracic films display fractures/l1 end plate fx  2. DVT Prophylaxis/Anticoagulation: Pharmaceutical: Lovenox 3. Pain Management: Will continue oxycodone bid with prn doses as needed. Lidocaine patch for neck pain  -gabapentin trial for feet 4. H/o anxiety disorder/Mood: Will continue xanax prn for now. Patient has poor insight and lacks awareness of deficits. LCSW to follow along for support and evaluation when indicated.  5. Neuropsych: This patient is not capable of making decisions on his own behalf.  -still requires restraints for safety---hopeful to remove soon  -low bed  -seems to be doing better with seroquel hs---increased to 50 which helped further 6. Skin/Wound Care: Pressure relief measures. Continue air mattress  overlay.  Eucerin cream for dry skin bilateral feet.  7. Fluids/Electrolytes/Nutrition: Monitor I/O. Electrolytes/bun/cr all look reasonable  -will back off bolus feeds given his good po intake currently  -continue same h20  flushes  8. ABLA:  hgb 11.5 9. Pulmonary: trach out. Stoma closed.   10.  Severe dysphagia,   -D1 diet-  11. Low am cbg's  LOS (Days) 23 A FACE TO FACE EVALUATION WAS PERFORMED  Hunter Myers T 08/01/2014 8:34 AM

## 2014-08-01 NOTE — Plan of Care (Signed)
Problem: RH BOWEL ELIMINATION Goal: RH STG MANAGE BOWEL WITH ASSISTANCE STG Manage Bowel with Assistance. Mod I  Outcome: Progressing Goal: RH STG MANAGE BOWEL W/MEDICATION W/ASSISTANCE STG Manage Bowel with Medication with Assistance. Mod I  Outcome: Progressing  Problem: RH BLADDER ELIMINATION Goal: RH STG MANAGE BLADDER WITH ASSISTANCE STG Manage Bladder With Assistance. Mod I  Outcome: Progressing  Problem: RH SKIN INTEGRITY Goal: RH STG SKIN FREE OF INFECTION/BREAKDOWN Remain free form skin breakdown while on Rehab with Min assist  Outcome: Progressing Goal: RH STG MAINTAIN SKIN INTEGRITY WITH ASSISTANCE STG Maintain Skin Integrity With mod Assistance.  Outcome: Progressing Goal: RH STG ABLE TO PERFORM INCISION/WOUND CARE W/ASSISTANCE STG Able To Perform Incision/Wound Care With total Assistance.  Outcome: Progressing  Problem: RH SAFETY Goal: RH STG ADHERE TO SAFETY PRECAUTIONS W/ASSISTANCE/DEVICE STG Adhere to Safety Precautions With Assistance/Device. Supervision  Outcome: Progressing Goal: RH STG DECREASED RISK OF FALL WITH ASSISTANCE STG Decreased Risk of Fall With max Assistance.  Outcome: Progressing  Problem: RH PAIN MANAGEMENT Goal: RH STG PAIN MANAGED AT OR BELOW PT'S PAIN GOAL Pain level less than 3  Outcome: Progressing     

## 2014-08-01 NOTE — Progress Notes (Signed)
Occupational Therapy Session Note  Patient Details  Name: Hunter MartesDwight Myers MRN: [id number] Date of Birth: 11/16/1945  Today's Date: 08/01/2014 OT Individual Time: 1000-1100 OT Individual Time Calculation (min): 60 min    Short Term Goals: Week 4:  OT Short Term Goal 1 (Week 4): Focus on LTGs  Skilled Therapeutic Interventions/Progress Updates:    Pt engaged in BALD retraining with focus on transfers, safety awareness, sit<>stand, standing balance, and following back precautions.  Pt performed squat pivot transfers w/c<>bed with supervision including setup and directing care.  Pt performed sit<>stand at sink with min A to bathe buttocks and pull up pants.  Pt required min verbal cues for following back precautions.    Therapy Documentation Precautions:  Precautions Precautions: Fall, Cervical, Back Precaution Comments: PEG Required Braces or Orthoses: Cervical Brace, Spinal Brace Cervical Brace: Hard collar Spinal Brace: Thoracolumbosacral orthotic, Applied in supine position Restrictions Weight Bearing Restrictions: No   Pain: Pain Assessment Pain Assessment: No/denies pain Pain Score: 0-No pain  See FIM for current functional status  Therapy/Group: Individual Therapy  Hunter BraveLanier, Hunter Myers 08/01/2014, 11:05 AM

## 2014-08-01 NOTE — Progress Notes (Signed)
Occupational Therapy Session Note  Patient Details  Name: Hunter Myers MRN: [id number] Date of Birth: 10/01/1945  Today's Date: 08/01/2014 OT Individual Time: [id number] OT Individual Time Calculation (min): 30 min    Short Term Goals: Week 4:  OT Short Term Goal 1 (Week 4): Focus on LTGs  Skilled Therapeutic Interventions/Progress Updates:  Upon entering room, pt seated in wheelchair with Wife and Daughter present in room. Family members left room secondary to therapy session although told they could remain. OT assisted pt to day room in wheelchair and set up for standing activity when pt began reporting feeling nauseated. Pt reporting, "I am too hot. I am going to get sick. Please help me take some of this off." Pt was wearing long sleeve shirt, abdominal binder, and back brace. OT assisted pt back to room where he performed scoot to bed from wheelchair with close supervision. Pt set up wheelchair independently for transfer. Sit >supine with supervision. OT assisted pt in removal of back brace with verbal cues for log rolling in order to maintain back precautions. Pt removed long sleeve shirt with Min A. OT discussed goal progression with pt as well. Pt supine in bed with call bell within reach upon exiting the room.   Therapy Documentation Precautions:  Precautions Precautions: Fall, Cervical, Back Precaution Comments: PEG Required Braces or Orthoses: Cervical Brace, Spinal Brace Cervical Brace: Hard collar Spinal Brace: Thoracolumbosacral orthotic, Applied in supine position Restrictions Weight Bearing Restrictions: No  See FIM for current functional status  Therapy/Group: Individual Therapy  Lowella Gripittman, Skylan Gift L 08/01/2014, 3:33 PM

## 2014-08-01 NOTE — Progress Notes (Signed)
Physical Therapy Session Note  Patient Details  Name: Dayton MartesDwight Salsberry MRN: [id number] Date of Birth: 10/10/1945  Today's Date: 08/01/2014 PT Individual Time: 0900-1000 and 1530-1600 PT Individual Time Calculation (min): 60 min and 30 min  Short Term Goals: Week 4:  PT Short Term Goal 1 (Week 4): STGs=LTGs  Skilled Therapeutic Interventions/Progress Updates:    AM Session: Patient received sitting in wheelchair at RN station. Session focused on functional mobility, R LE NMR, and cognitive remediation. Wheelchair mobility >150' with B LE and supervision with emphasis on R knee ext/flex to assist with propulsion. Patient able to recall set up and all steps for squat pivot transfer wheelchair<>mat with supervision. In [place], patient engaged in game of Go Fish with emphasis on anterior weight shifts to reach for card; patient requires min cues for proper game play and able to sustain attention to game in busy gym x15'.  Stair negotiation x5 stairs with B handrails and minA, patient able to recall proper sequencing when asked. Patient requires seated rest break after activity. Bed mobility on flat mat with supervision for sit<>supine. Patient returned to RN station and left sitting in wheelchair with seatbelt donned.  PM Session: Patient received supine in bed. Patient performed supine>sit from flat bed without bed rails and supervision/verbal cues for proper log rolling technique and sequencing. Assisted patient with donning TLSO in sitting, squat pivot transfer bed>wheelchair with supervision. Wheelchair mobility >150' with B LE and supervision with emphasis on use of R LE. Forward and backward walking with R handrail and minA (R toe off AFO donned), emphasis on widening BOS. Patient returned to RN station and left seated in wheelchair with seatbelt donned.  Therapy Documentation Precautions:  Precautions Precautions: Fall, Cervical, Back Precaution Comments: PEG Required Braces or Orthoses:  Cervical Brace, Spinal Brace Cervical Brace: Hard collar Spinal Brace: Thoracolumbosacral orthotic, Applied in supine position Restrictions Weight Bearing Restrictions: No Pain: Pain Assessment Pain Assessment: No/denies pain Pain Score: 0-No pain Locomotion : Ambulation Ambulation/Gait Assistance: 4: Min assist Wheelchair Mobility Distance: 150   See FIM for current functional status  Therapy/Group: Individual Therapy  Chipper HerbBridget S Tramar Brueckner S. Jacarius Handel, PT, DPT 08/01/2014, 4:20 PM

## 2014-08-02 ENCOUNTER — Inpatient Hospital Stay (HOSPITAL_COMMUNITY): Payer: Medicare Other | Admitting: Physical Therapy

## 2014-08-02 MED ORDER — RESOURCE THICKENUP CLEAR PO POWD
ORAL | Status: DC | PRN
  Filled 2014-08-02 (×2): qty 125

## 2014-08-02 MED ORDER — FREE WATER
200.0000 mL | Freq: Four times a day (QID) | Status: DC
  Administered 2014-08-02 – 2014-08-03 (×7): 200 mL

## 2014-08-02 NOTE — Plan of Care (Signed)
Problem: RH BOWEL ELIMINATION Goal: RH STG MANAGE BOWEL WITH ASSISTANCE STG Manage Bowel with Assistance. Mod I  Outcome: Progressing     
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
## 2014-08-02 NOTE — Plan of Care (Signed)
Problem: RH BLADDER ELIMINATION Goal: RH STG MANAGE BLADDER WITH ASSISTANCE STG Manage Bladder With Assistance. Mod I  Outcome: Progressing     

## 2014-08-02 NOTE — Plan of Care (Signed)
Problem: RH SAFETY Goal: RH STG ADHERE TO SAFETY PRECAUTIONS W/ASSISTANCE/DEVICE STG Adhere to Safety Precautions With Assistance/Device. Supervision  Outcome: Progressing     

## 2014-08-02 NOTE — Progress Notes (Signed)
Physical Therapy Session Note  Patient Details  Name: Hunter Myers MRN: [id number] Date of Birth: 1945-10-29  Today's Date: 08/02/2014 PT Individual Time: 1545-1620 PT Individual Time Calculation (min): 35 min   Short Term Goals: Week 1:  PT Short Term Goal 1 (Week 1): Patient will perform bed mobility with use of hospital bed functions and modA. PT Short Term Goal 1 - Progress (Week 1): Met PT Short Term Goal 2 (Week 1): Patient will perform functional transfers with maxA x1. PT Short Term Goal 2 - Progress (Week 1): Met PT Short Term Goal 3 (Week 1): Patient will perform gait training 47' with +2 assist. PT Short Term Goal 3 - Progress (Week 1): Met PT Short Term Goal 4 (Week 1): Patient will negotiate 2 steps with B handrails and +2 assist. PT Short Term Goal 4 - Progress (Week 1): Met PT Short Term Goal 5 (Week 1): Patient will demonstrate carry over within session with mobility techniques with mod cues. PT Short Term Goal 5 - Progress (Week 1): Progressing toward goal Week 2:  PT Short Term Goal 1 (Week 2): Patient will perform bed mobility from flat surface without bedrails and minA. PT Short Term Goal 1 - Progress (Week 2): Met PT Short Term Goal 2 (Week 2): Patient will perform functional transfers with modA consistently. PT Short Term Goal 2 - Progress (Week 2): Met PT Short Term Goal 3 (Week 2): Patient will perform gait training x50' with LRAD and [place]. PT Short Term Goal 3 - Progress (Week 2): Progressing toward goal PT Short Term Goal 4 (Week 2): Patient will negotiate 5 steps with B handrails and modA. PT Short Term Goal 4 - Progress (Week 2): Met PT Short Term Goal 5 (Week 2): Patient will demonstrate carry over within session with mobility techniques with mod cues. PT Short Term Goal 5 - Progress (Week 2): Met Week 3:  PT Short Term Goal 1 (Week 3): Patient will perform bed mobility from flat bed surface without bed rails with supervision PT Short Term Goal 1 -  Progress (Week 3): Met PT Short Term Goal 2 (Week 3): Patient will perform functional transfers with supervision 50% of the time PT Short Term Goal 2 - Progress (Week 3): Met PT Short Term Goal 3 (Week 3): Patietn will perform gait training x50' with LRAD and modA of 1 person PT Short Term Goal 3 - Progress (Week 3): Met PT Short Term Goal 4 (Week 3): Patient will negotiate 5 steps with B handrails and min A PT Short Term Goal 4 - Progress (Week 3): Met PT Short Term Goal 5 (Week 3): Patient will demonstrate carry over across sessions with mobility techniques with min cues PT Short Term Goal 5 - Progress (Week 3): Met Week 4:  PT Short Term Goal 1 (Week 4): STGs=LTGs  Skilled Therapeutic Interventions/Progress Updates:    Pt demonstrates improved ability to transfer s/p cueing in session. Pt initially with posterior lean vs transfer surface to assist, but no leaning vs transfer surface in later transfers. Pt able to carryover cueing from different contexts in session. Pt tangential and with decreased attention at points throughout session benefiting from behavior management. Pt tearful at end of session.  Therapy Documentation Precautions:  Precautions Precautions: Fall, Cervical, Back Precaution Comments: PEG Required Braces or Orthoses: Cervical Brace, Spinal Brace Cervical Brace: Hard collar Spinal Brace: Thoracolumbosacral orthotic, Applied in supine position Restrictions Weight Bearing Restrictions: No Pain: Pain Assessment Pain Assessment: No/denies pain Mobility:  Min Guard transfers with cues for ventilation, weight shift, and LE placement Locomotion : Ambulation Ambulation/Gait Assistance: 4: Min assist  Other Treatments:  Pt performs blocked transfers 3x5 on mat in session, followed by random W/C transfers 2x5. Pt educated on rehab plan and weight shift. Anterior weight shifts performed 2x10 in session. Static standing 1'x5 in session.  See FIM for current functional  status  Therapy/Group: Individual Therapy  Monia Pouch 08/02/2014, 6:29 PM

## 2014-08-03 ENCOUNTER — Inpatient Hospital Stay (HOSPITAL_COMMUNITY): Payer: Medicare Other

## 2014-08-03 ENCOUNTER — Inpatient Hospital Stay (HOSPITAL_COMMUNITY): Payer: Medicare Other | Admitting: Speech Pathology

## 2014-08-03 ENCOUNTER — Inpatient Hospital Stay (HOSPITAL_COMMUNITY): Payer: Medicare Other | Admitting: *Deleted

## 2014-08-03 LAB — GLUCOSE, CAPILLARY: Glucose-Capillary: 139 mg/dL — ABNORMAL HIGH (ref 70–99)

## 2014-08-03 MED ORDER — PRO-STAT SUGAR FREE PO LIQD
30.0000 mL | Freq: Three times a day (TID) | ORAL | Status: DC
  Administered 2014-08-03 – 2014-08-06 (×8): 30 mL via ORAL
  Filled 2014-08-03 (×11): qty 30

## 2014-08-03 MED ORDER — ENSURE PUDDING PO PUDG
1.0000 | Freq: Three times a day (TID) | ORAL | Status: DC
  Administered 2014-08-03 – 2014-08-05 (×8): 1 via ORAL
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
## 2014-08-03 NOTE — Progress Notes (Signed)
Occupational Therapy Session Note  Patient Details  Name: Hunter MartesDwight Nies MRN: [id number] Date of Birth: 07/19/1946  Today's Date: 08/03/2014 OT Individual Time: 1000-1100 and 1300-1400 OT Individual Time Calculation (min): 60 min and 60 min     Short Term Goals: Week 4:  OT Short Term Goal 1 (Week 4): Focus on LTGs  Skilled Therapeutic Interventions/Progress Updates:    Session 1: Pt seen for ADL retraining with focus on functional transfers, standing balance, adherence to precautions, and use of AE. Pt received sitting in w/c following PT session. Completed squat pivot transfer w/c>bed at supervision level with min cues for setup. Completed bathing and dressing sitting EOB with min assist for standing balance and no cues for use of AE. Pt ambulated short distance to sink and completed oral care in standing at steadying assist. Pt propelled self to day room using BLEs. Engaged in plant watering task with focus on standing balance and functional mobility as pt side-stepped with min assist down counter. Pt fatigued quickly during side-stepping activity. Pt propelled self back to room and transferred w/c>bed at supervision level. Pt left supine in bed with all needs in reach.  Session 2: Pt seen for 1:1 OT session with focus on BUE strength, activity tolerance, standing balance, and cognitive remediation. Pt received sitting in w/c with family present. Ambulated w/c<>toilet (approx 8 feet) with min assist. Pt completed hygiene and clothing management with min assist for balance and increased time. Pt propelled self in w/c around hospital, alternating use of BUEs and BLEs to increase strength and activity tolerance. In lobby, pt stood with min assist to look at history of hospital (pt's choice). Pt completed side stepping with BUEs supported and min assist to read all of history. Pt then propelled self back to room with min cues to utilize signs for way finding. Pt returned to room and left supine in bed  with all needs in reach.    Therapy Documentation Precautions:  Precautions Precautions: Fall, Cervical, Back Precaution Comments: PEG Required Braces or Orthoses: Cervical Brace, Spinal Brace Cervical Brace: Hard collar Spinal Brace: Thoracolumbosacral orthotic, Applied in supine position Restrictions Weight Bearing Restrictions: No General:   Vital Signs: Therapy Vitals Pulse Rate: 80 BP: 133/79 mmHg Pain: No report of pain  See FIM for current functional status  Therapy/Group: Individual Therapy  [name], Kaylany Tesoriero N 08/03/2014, 12:22 PM

## 2014-08-03 NOTE — Plan of Care (Signed)
Problem: RH Bed to Chair Transfers Goal: LTG Patient will perform bed/chair transfers w/assist (PT) LTG: Patient will perform bed/chair transfers with assistance, with/without cues (PT).  Upgraded due to decreased anticipated need for physical assist and improve carryover of techniques and safety  Problem: RH Furniture Transfers Goal: LTG Patient will perform furniture transfers w/assist (OT/PT LTG: Patient will perform furniture transfers with assistance (OT/PT).  Upgraded due to decreased anticipated need for physical assist and improve carryover of techniques and safety  Problem: RH Ambulation Goal: LTG Patient will ambulate in controlled environment (PT) LTG: Patient will ambulate in a controlled environment, # of feet with assistance (PT).  Upgraded due to improved functional endurance, balance, coordination, and emergent awareness

## 2014-08-03 NOTE — Progress Notes (Signed)
Speech Language Pathology Daily Session Note  Patient Details  Name: Hunter Myers MRN: [id number] Date of Birth: 08/24/1946  Today's Date: 08/03/2014 SLP Individual Time: 0800-0900 SLP Individual Time Calculation (min): 60 min  Short Term Goals: Week 4: SLP Short Term Goal 1 (Week 4): Patient will utilize an increased vocal intensity at the phrase level with Min A multimodal cues.  SLP Short Term Goal 2 (Week 4): Patient will consume Dys. 1 textures with nectar-thick liquids via tsp with minimal overt s/s of aspiration across 3 consecutive sessions with Min A mutlimodal cues for utilization of swallowing compensatory strategies.   SLP Short Term Goal 3 (Week 4): Patient will identify 2 physical and 2 cognitive deficits with Min A multimodal cues.  SLP Short Term Goal 4 (Week 4): Patient will orient to time, place and situation with supervision multimodal cues.  SLP Short Term Goal 5 (Week 4): Patient will demonstrate sustained attention to a functional and familiar task for 15 minutes with Min A multimodal cues cues.   Skilled Therapeutic Interventions: Skilled treatment session focused on addressing dysphagia and cognitive goals. Upon arrival, patient was asleep while supine in bed but was easily awakened and agreeable to participate in treatment session.  [name] TLSO was donned and patient was transferred to the wheelchair with extra time and supervision cues for safety. Patient required extra time to complete basic self-care tasks (brushing hair and teeth and washing face/hands) but was Mod I for functional problem solving with tasks. Patient consumed current diet of Dys. 1 textures with nectar-thick liquids without overt s/s of aspiration and required supervision multimodal cues for utilization of multiple swallows, alternating solids/liquids and small bites/sips. Patient was oriented to place and situation with Mod I and required Min A multimodal cues for orientation to date.  Patient  continues to demonstrate an increased vocal intensity and was 100% intelligible at the sentence level. Patient was handed off to PT. Continue with current plan of care.   FIM:  Comprehension Comprehension Mode: Auditory Comprehension: 4-Understands basic 75 - 89% of the time/requires cueing 10 - 24% of the time Expression Expression Mode: Verbal Expression: 5-Expresses basic needs/ideas: With extra time/assistive device Social Interaction Social Interaction: 5-Interacts appropriately 90% of the time - Needs monitoring or encouragement for participation or interaction. Problem Solving Problem Solving: 4-Solves basic 75 - 89% of the time/requires cueing 10 - 24% of the time Memory Memory: 3-Recognizes or recalls 50 - 74% of the time/requires cueing 25 - 49% of the time FIM - Eating Eating Activity: 5: Supervision/cues  Pain Pain Assessment Pain Assessment: No/denies pain  Therapy/Group: Individual Therapy  Jalene Lacko 08/03/2014, 3:21 PM

## 2014-08-03 NOTE — Plan of Care (Signed)
Problem: RH BOWEL ELIMINATION Goal: RH STG MANAGE BOWEL WITH ASSISTANCE STG Manage Bowel with Assistance. Mod I  Outcome: Progressing Goal: RH STG MANAGE BOWEL W/MEDICATION W/ASSISTANCE STG Manage Bowel with Medication with Assistance. Mod I  Outcome: Progressing  Problem: RH BLADDER ELIMINATION Goal: RH STG MANAGE BLADDER WITH ASSISTANCE STG Manage Bladder With Assistance. Mod I  Outcome: Progressing  Problem: RH SKIN INTEGRITY Goal: RH STG SKIN FREE OF INFECTION/BREAKDOWN Remain free form skin breakdown while on Rehab with Min assist  Outcome: Progressing Goal: RH STG MAINTAIN SKIN INTEGRITY WITH ASSISTANCE STG Maintain Skin Integrity With mod Assistance.  Outcome: Progressing Goal: RH STG ABLE TO PERFORM INCISION/WOUND CARE W/ASSISTANCE STG Able To Perform Incision/Wound Care With total Assistance.  Outcome: Progressing  Problem: RH SAFETY Goal: RH STG ADHERE TO SAFETY PRECAUTIONS W/ASSISTANCE/DEVICE STG Adhere to Safety Precautions With Assistance/Device. Supervision  Outcome: Progressing Goal: RH STG DECREASED RISK OF FALL WITH ASSISTANCE STG Decreased Risk of Fall With max Assistance.  Outcome: Progressing  Problem: RH PAIN MANAGEMENT Goal: RH STG PAIN MANAGED AT OR BELOW PT'S PAIN GOAL Pain level less than 3  Outcome: Progressing     

## 2014-08-03 NOTE — Progress Notes (Signed)
Physical Therapy Session Note  Patient Details  Name: Hunter Myers MRN: [id number] Date of Birth: 06/30/1946  Today's Date: 08/03/2014 PT Individual Time:AM Session: 0900-1000 PM Session: 1630-1700 PT Individual Time Calculation (min): 60 min and 30 min   Short Term Goals: Week 4:  PT Short Term Goal 1 (Week 4): STGs=LTGs  Skilled Therapeutic Interventions/Progress Updates:    AM Session: Pt found finishing breakfast  with SLP. Session focused on functional endurance with w/c propulsion, standing balance during functional activities, and functional transfers. PT supervised pt finishing breakfast with min verbal cues to maintain adherence to diet and eating precautions. Throughout session pt propelled w/c 300' (day room>pt room<>rehab gym) using BUE and BLE with supervision and verbal cues to attend to RLE advancement. Pt assisted with use of restroom, requiring min A for t/f <> w/c to commode and mod A for clothing management; functional standing balance challenged with self-care after toiletting and with handwashing at sink, performed with min A. Attempted ambulation with RW but pt's undergarments not staying in place and hindered performance; pt returned to room to manage undergarments, PT emphasized functional activity with w/c<>bed t/f and standing balance to don new briefs, min A provided throughout with verbal cues for sequencing and technique. Pt left with OT for following therapy session.   PM Session: Pt found asleep in room. Session focused on functional endurance, functional mobility, and NMR. Pt required use of urinal and performed self-care with min A for steadying pt. Pt self-propelled w/c 150' in hospital hallway with BUE and BLE with supervision and verbal cues for attention to RLE. Pt ambulated 200' with RW and min A for steadying, verbal cues provided for increasing BOS, increased step length with LLE, and selective attention on task in mildly busy hallway.  Focus on NMR to address  gait deficits by performing side-steps at hallway rail using rail for BUE support and min A; pt side-stepped 20' x2 (to L and R) with step over obstacles to promote increased BOS, magnitude of steps, hip abduction, and emergent awareness; min verbal cues provided for foot placement and technique. Pt was left at nurse's station with pink safety belt in place.   Therapy Documentation Precautions:  Precautions Precautions: Fall, Cervical, Back Precaution Comments: PEG Required Braces or Orthoses: Cervical Brace, Spinal Brace Cervical Brace: Hard collar Spinal Brace: Thoracolumbosacral orthotic, Applied in supine position Restrictions Weight Bearing Restrictions: No Vital Signs: Therapy Vitals Temp: 98.3 F (36.8 C) Temp Source: Oral Pulse Rate: 79 Resp: 18 BP: 109/67 mmHg Patient Position (if appropriate): Lying Oxygen Therapy SpO2: 100 % O2 Device: Not Delivered Pain: Pain Assessment Pain Assessment: No/denies pain Locomotion : Ambulation Ambulation/Gait Assistance: 4: Min assist   See FIM for current functional status  Therapy/Group: Individual Therapy  Hunter Myers 08/03/2014, 4:55 PM

## 2014-08-04 ENCOUNTER — Inpatient Hospital Stay (HOSPITAL_COMMUNITY): Payer: Medicare Other | Admitting: Speech Pathology

## 2014-08-04 ENCOUNTER — Inpatient Hospital Stay (HOSPITAL_COMMUNITY): Payer: Medicare Other | Admitting: *Deleted

## 2014-08-04 ENCOUNTER — Encounter (HOSPITAL_COMMUNITY): Payer: Medicare Other

## 2014-08-04 LAB — CBC
HCT: 34.5 % — ABNORMAL LOW (ref 39.0–52.0)
Hemoglobin: 10.9 g/dL — ABNORMAL LOW (ref 13.0–17.0)
MCH: 27.9 pg (ref 26.0–34.0)
MCHC: 31.6 g/dL (ref 30.0–36.0)
MCV: 88.5 fL (ref 78.0–100.0)
Platelets: 233 10*3/uL (ref 150–400)
RBC: 3.9 MIL/uL — ABNORMAL LOW (ref 4.22–5.81)
RDW: 15.4 % (ref 11.5–15.5)
WBC: 5.7 10*3/uL (ref 4.0–10.5)

## 2014-08-04 LAB — BASIC METABOLIC PANEL
Anion gap: 10 (ref 5–15)
BUN: 18 mg/dL (ref 6–23)
CHLORIDE: 102 meq/L (ref 96–112)
CO2: 29 mEq/L (ref 19–32)
Calcium: 9.2 mg/dL (ref 8.4–10.5)
Creatinine, Ser: 0.63 mg/dL (ref 0.50–1.35)
GLUCOSE: 70 mg/dL (ref 70–99)
Potassium: 4.2 mEq/L (ref 3.7–5.3)
Sodium: 141 mEq/L (ref 137–147)

## 2014-08-04 LAB — GLUCOSE, CAPILLARY: Glucose-Capillary: 80 mg/dL (ref 70–99)

## 2014-08-04 MED ORDER — METOPROLOL TARTRATE 25 MG/10 ML ORAL SUSPENSION: 5.0000 mg | Freq: Three times a day (TID) | ORAL | Status: AC

## 2014-08-04 MED ORDER — PRO-STAT SUGAR FREE PO LIQD: 30.0000 mL | Freq: Three times a day (TID) | ORAL | Status: AC

## 2014-08-04 MED ORDER — CALCIUM CARBONATE 1250 MG/5ML PO SUSP: 500.0000 mg | Freq: Two times a day (BID) | ORAL | Status: AC

## 2014-08-04 MED ORDER — METHYLPHENIDATE HCL 5 MG PO TABS
10.0000 mg | ORAL_TABLET | Freq: Two times a day (BID) | ORAL | Status: DC
  Administered 2014-08-04 – 2014-08-06 (×4): 10 mg
  Filled 2014-08-04 (×4): qty 2

## 2014-08-04 MED ORDER — FREE WATER
100.0000 mL | Freq: Four times a day (QID) | Status: DC
  Administered 2014-08-04 – 2014-08-06 (×9): 100 mL

## 2014-08-04 MED ORDER — ACETAMINOPHEN 325 MG PO TABS: 325.0000 mg | ORAL_TABLET | ORAL | Status: AC | PRN

## 2014-08-04 MED ORDER — FREE WATER: 100.0000 mL | Freq: Four times a day (QID) | Status: AC

## 2014-08-04 MED ORDER — GABAPENTIN 250 MG/5ML PO SOLN: 100.0000 mg | Freq: Every day | ORAL | Status: AC

## 2014-08-04 MED ORDER — SENNOSIDES 8.8 MG/5ML PO SYRP: 10.0000 mL | ORAL_SOLUTION | Freq: Two times a day (BID) | ORAL | Status: AC

## 2014-08-04 MED ORDER — POTASSIUM CHLORIDE 20 MEQ/15ML (10%) PO SOLN: 20.0000 meq | Freq: Two times a day (BID) | ORAL | Status: AC

## 2014-08-04 MED ORDER — METHYLPHENIDATE HCL 10 MG PO TABS: 10.0000 mg | ORAL_TABLET | Freq: Two times a day (BID) | ORAL | Status: AC

## 2014-08-04 MED ORDER — HYDROCERIN EX CREA: 1.0000 "application " | TOPICAL_CREAM | Freq: Two times a day (BID) | CUTANEOUS | Status: AC

## 2014-08-04 MED ORDER — QUETIAPINE FUMARATE 50 MG PO TABS: 50.0000 mg | ORAL_TABLET | Freq: Every day | ORAL | Status: AC

## 2014-08-04 MED ORDER — OXYCODONE HCL 5 MG PO TABS: 5.0000 mg | ORAL_TABLET | Freq: Two times a day (BID) | ORAL | Status: AC

## 2014-08-04 MED ORDER — BISACODYL 10 MG RE SUPP: 10.0000 mg | Freq: Every day | RECTAL | Status: AC | PRN

## 2014-08-04 MED ORDER — ENSURE PUDDING PO PUDG: 1.0000 | Freq: Three times a day (TID) | ORAL | Status: AC

## 2014-08-04 NOTE — Progress Notes (Signed)
[facility]     PROGRESS NOTE    Subjective/Complaints: Slept soundly. Denies pain. No new complaints ROS limited by mental status  Objective: Vital Signs: Blood pressure 123/62, pulse 60, temperature 97.9 F (36.6 C), temperature source Oral, resp. rate 18, height 5\' 10"  (1.778 m), weight 56.7 kg (125 lb), SpO2 100 %. No results found.  Recent Labs  08/04/14 0235  WBC 5.7  HGB 10.9*  HCT 34.5*  PLT 233    Recent Labs  08/04/14 0235  NA 141  K 4.2  CL 102  GLUCOSE 70  BUN 18  CREATININE 0.63  CALCIUM 9.2   CBG (last 3)   Recent Labs  08/03/14 2111 08/04/14 0641  GLUCAP 139* 80    Wt Readings from Last 3 Encounters:  08/04/14 56.7 kg (125 lb)  07/09/14 58.514 kg (129 lb)    Physical Exam:   Constitutional: He appears well-developed.   Cervical collar in place HENT: Voice is hoarse but air leakage through trach, Head: Normocephalic and atraumatic.  Eyes: Conjunctivae are normal. Pupils are equal, round, and reactive to light.  Neck: air escaping from stoma--area clean. Cardiovascular: Regular rhythm.  Respiratory: Effort normal and breath sounds normal. No respiratory distress. He has no wheezes.  GI: Soft. Bowel sounds are normal. He exhibits no distension.  PEG site clean. Mild tenderness around the PEG site Musculoskeletal: He exhibits no edema.  Unable to raise left shoulder.  Neurological: He is alert.  Oriented to self and place. Still with decreased awareness and insight. Less Restless. dysphonia persists.     3+ prox to 4/5 distally in UE's. LE: Left HF 3/5. RHF 2/5. 3 minus bilateral ankle dorsiflexors Skin: Skin is warm and dry. Mild erythema around the PICC site right medial arm Bilateral heels dry    Assessment/Plan: 1. 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 Physiatrist is providing close team supervision and 24 hour management of active medical problems listed below. Physiatrist and rehab team continue to assess barriers to discharge/monitor patient progress toward functional and medical goals.  Goals upgraded due to improved awareness, carryover, and attention.---d/w team regarding dispo today   FIM: FIM - Bathing Bathing Steps Patient Completed: Chest, Right Arm, Left Arm, Abdomen, Front perineal area, Right upper leg, Left upper leg, Left lower leg (including foot), Right lower leg (including foot), Buttocks Bathing: 4: Steadying assist  FIM - Upper Body Dressing/Undressing Upper body dressing/undressing steps patient completed: Thread/unthread right sleeve of front closure shirt/dress, Thread/unthread left sleeve of front closure shirt/dress, Button/unbutton shirt Upper body dressing/undressing: 4: Min-Patient completed 75 plus % of tasks FIM - Lower Body Dressing/Undressing Lower body dressing/undressing steps patient completed: Thread/unthread left pants leg, Pull pants up/down, Don/Doff right sock, Don/Doff left sock, Thread/unthread right pants leg, Don/Doff left shoe, Fasten/unfasten left shoe Lower body dressing/undressing: 4: Min-Patient completed 75 plus % of tasks  FIM - Toileting Toileting steps completed by patient: Adjust clothing prior to toileting, Performs perineal hygiene Toileting Assistive Devices: Grab bar or rail for support Toileting: 3: Mod-Patient completed 2 of 3 steps  FIM - Diplomatic Services operational officerToilet Transfers Toilet Transfers Assistive Devices: Grab bars Toilet Transfers: 4-From toilet/BSC: Min A (steadying Pt. > 75%), 4-To toilet/BSC: Min A (steadying Pt. > 75%)  FIM - Bed/Chair Transfer Bed/Chair Transfer Assistive Devices: Bed rails, Arm rests, Manufacturing systems engineerWalker Bed/Chair Transfer: 4: Bed > Chair  or W/C: Min A (steadying Pt. > 75%), 4: Chair or W/C > Bed: Min A (steadying Pt. > 75%), 5: Supine >  Sit: Supervision (verbal cues/safety issues)  FIM - Locomotion: Wheelchair Distance: 150 Locomotion: Wheelchair: 5: Travels 150 ft or more: maneuvers on rugs and over door sills with supervision, cueing or coaxing FIM - Locomotion: Ambulation Locomotion: Ambulation Assistive Devices: Designer, industrial/productWalker - Rolling Ambulation/Gait Assistance: 4: Min assist Locomotion: Ambulation: 4: Travels 150 ft or more with minimal assistance (Pt.>75%)  Comprehension Comprehension Mode: Auditory Comprehension: 4-Understands basic 75 - 89% of the time/requires cueing 10 - 24% of the time  Expression Expression Mode: Verbal Expression: 5-Expresses basic needs/ideas: With extra time/assistive device  Social Interaction Social Interaction Mode: Asleep Social Interaction: 5-Interacts appropriately 90% of the time - Needs monitoring or encouragement for participation or interaction.  Problem Solving Problem Solving Mode: Asleep Problem Solving: 4-Solves basic 75 - 89% of the time/requires cueing 10 - 24% of the time  Memory Memory Mode: Asleep Memory: 3-Recognizes or recalls 50 - 74% of the time/requires cueing 25 - 49% of the time Medical Problem List and Plan: 1. Functional deficits secondary to TBI, cervical and thoracic spine fractures.   -Surgery requests that pt be in [place] for at least 3 months. Can be up to 30 degrees without TLSO  -cervical xr without fx but demineralization and diffuse facet disease  -thoracic films display fractures/l1 end plate fx  2. DVT Prophylaxis/Anticoagulation: Pharmaceutical: Lovenox 3. Pain Management: Will continue oxycodone bid with prn doses as needed. Lidocaine patch for neck pain  -gabapentin for feet 4. H/o anxiety disorder/Mood: Will continue xanax prn for now. Patient has poor insight and lacks awareness of deficits. LCSW to follow along for support and evaluation when indicated.  5. Neuropsych: This patient is not capable of making decisions on his own  behalf.  -still requires restraints for safety---hopeful to remove soon  -low bed  -seems to be doing better with seroquel hs---increased to 50 which helped further 6. Skin/Wound Care: Pressure relief measures. Continue air mattress overlay.  Eucerin cream for dry skin bilateral feet.  7. Fluids/Electrolytes/Nutrition: Monitor I/O. Electrolytes/bun/cr all look reasonable  -off bolus feeds entirely  -weaning  h20  flushes 8. ABLA:  hgb 11.5 9. Pulmonary: trach out. Stoma closed.   10.  Severe dysphagia,   -D1 diet with nectars     LOS (Days) 26 A FACE TO FACE EVALUATION WAS PERFORMED  SWARTZ,ZACHARY T 08/04/2014 7:30 AM
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
## 2014-08-04 NOTE — Progress Notes (Signed)
Speech Language Pathology Session Note & Discharge Summary  Patient Details  Name: Hunter Myers MRN: [id number] Date of Birth: Jul 29, 1946  Today's Date: 08/04/2014 SLP Individual Time: 1300-1400 SLP Individual Time Calculation (min): 60 min   Skilled Therapeutic Interventions:  Skilled treatment session focused on cognitive-linguistic goals. SLP facilitated session by administering the [facility] Cognitive Assessment). Patient scored a 23/30 with a total score of 26 and above is considered normal. SLP also facilitated session with a functional conversation that focused on anticipatory awareness and strategies to utilize at his next venue of care to maximize his safety in regards to calling for assistance, wearing his C-collar at all times and utilizing spoons to consume nectar-thick liquids. A handout was also created to reinforce information. Patient verbalized understanding and asked appropriate questions which is indicative of improved awareness of deficits. Patient handed off to PT.   Patient has met 8 of 8 long term goals.  Patient to discharge at [facility] level.   Reasons goals not met: N/A   Clinical Impression/Discharge Summary: Patient has made functional gains and has met 8 of 8 LTG's this admission due to increased sustained attention, intellectual awareness, functional problem solving, utilization of an increased vocal intensity and swallowing function.  Patient is currently consuming Dys. 1 textures with nectar-thick liquids via tsp with minimal overt s/s of aspiration and requires Min A multimodal cues for utilization of swallowing compensatory strategies, recommend patient have repeat FEES within the next week to assess for possible diet upgrade. Patient is 100% intelligible and is demonstrating an increased vocal intensity but it can be hoarse at times, especially in the evenings. Patient is demonstrating behaviors consistent with a Rancho Level VII and requires overall Min A  multimodal cues for sustained-selective attention, emergent awareness, functional problem solving and working memory. [name] family is unable to provide the necessary physical and cognitive assistance needed at this time, therefore, patient will d/c to a SNF. Patient would benefit from continued skilled SLP intervention to maximize his cognitive-linguistic function and swallow function in order to maximize his overall functional independence prior to discharge.  Care Partner:  Caregiver Able to Provide Assistance: No  Type of Caregiver Assistance: Physical;Cognitive  Recommendation:  Skilled Nursing facility;24 hour supervision/assistance  Rationale for SLP Follow Up: Maximize cognitive function and independence;Reduce caregiver burden;Maximize functional communication;Maximize swallowing safety   Equipment: Thickener    Reasons for discharge: Treatment goals met;Discharged from hospital   Patient/Family Agrees with Progress Made and Goals Achieved: Yes   See FIM for current functional status  Halimah Bewick 08/04/2014, 3:33 PM  [place]on Anna, [place], [place]

## 2014-08-04 NOTE — Progress Notes (Signed)
Social Work Patient ID: Hunter Myers, male   DOB: 12-03-1945, 68 y.o.   MRN: [id number]  Amada Jupiter, LCSW Social Worker Signed  Patient Care Conference 08/04/2014  4:43 PM    Expand All Collapse All   Inpatient RehabilitationTeam Conference and Plan of Care Update Date: 08/04/2014   Time: 3:00 PM     Patient Name: Hunter Myers       Medical Record Number: [id number]  Date of Birth: 04-Apr-1946 Sex: Male         Room/Bed: 4W13C/4W13C-01 Payor Info: Payor: MEDICARE / Plan: MEDICARE PART A AND B / Product Type: *No Product type* /    Admitting Diagnosis: major mult trauma  [facility]  WITH RESP FAILURE TRACH   Admit Date/Time:  07/09/2014  3:47 PM Admission Comments: No comment available   Primary Diagnosis:  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  Multiple fractures of cervical spine  07/09/2014   .  Dysphagia, pharyngoesophageal phase     .  Severe malnutrition     .  Acute respiratory failure  06/09/2014   .  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  HCAP (healthcare-associated pneumonia)  06/09/2014   .  C5 vertebral fracture  06/09/2014   .  Acute on chronic respiratory failure  06/09/2014   .  Tracheostomy status  06/09/2014     Expected Discharge Date: Expected Discharge Date: 08/05/14 (to SNF)  Team Members Present: Physician leading conference: Dr. Faith Rogue Social Worker Present: Amada Jupiter, LCSW Nurse Present: Ronny Bacon, RN PT Present: Cyndia Skeeters, Scot Jun, PT OT Present: Ardis Rowan, COTA;Jennifer Katrinka Blazing, OT SLP Present: Feliberto Gottron, SLP PPS Coordinator present : Tora Duck, RN, CRRN        Current Status/Progress  Goal  Weekly Team Focus   Medical     improved insight and awareness  finalize dc planning   finalize dc planning   Bowel/Bladder     continent of bowel and bladder with mod assist   Mod I for bowel bladder  offer toilet q2-3 hr and PRN   Swallow/Nutrition/ Hydration     Dys. 1 textures with nectar-thick liquids via spoon, Min A   Mod A with least restrictive diet  D/C to SNF tomorrow    ADL's     min assist overall; min-supervision transfers    min assist overall, supervision UB dressing and toilet transfers   transfers, standing balance, cognitive remediation, safety awareness, education   Mobility     supervision-min A with transfers, min A with ambulation and stairs, and supervision with wheelchair   LTGs upgraded supervision for t/f, w/c propulsion, and bed mobility; min A with standing balance, ambulation, and negotiation of stairs   cognitive remediation, functional mobility, balance, postural control, activity tolerance, safety awareness, and education    Communication     Min A  Min A at sentence level   D/C to SNF placement   Safety/Cognition/ Behavioral Observations    Min-Mod A  Mod A  D/C to SNF tomorrow    Pain     neck/back pain relieved with lidoderm patch and scheduled and prn doses of oxycodone  pain equal to or lesss than 3  on scale of 0-10  assess pain q shift and prn   Skin     Dry skin to bilateral heels, blanchable skin to sacrum, healing trach site  No new skin injury/breakdown  assess skin q shift and prn, turn q2h in bed, reposition q1h in chair    Rehab Goals Patient on target to meet rehab goals: Yes *See Care Plan and progress notes for long and short-term goals.    Barriers to Discharge:  braces, safety     Possible Resolutions to Barriers:   supervision, continued therapy     Discharge Planning/Teaching Needs:   To d/c to SNF tomorrow       Team Discussion:    Ready for d/c to SNF tomorrow.  Need to change meds to pill form.  Good gains in cognition this week.   Revisions to Treatment Plan:    None    Continued Need for Acute  Rehabilitation Level of Care: The patient requires daily medical management by a physician with specialized training in physical medicine and rehabilitation for the following conditions: Daily direction of a multidisciplinary physical rehabilitation program to ensure safe treatment while eliciting the highest outcome that is of practical value to the patient.: Yes Daily medical management of patient stability for increased activity during participation in an intensive rehabilitation regime.: Yes Daily analysis of laboratory values and/or radiology reports with any subsequent need for medication adjustment of medical intervention for : Post surgical problems;Neurological problems  Theresa Wedel 08/04/2014, 4:43 PM                 Amada Jupiter, LCSW Social Worker Signed  Patient Care Conference 07/23/2014  2:05 PM    Expand All Collapse All   Inpatient RehabilitationTeam Conference and Plan of Care Update Date: 07/21/2014   Time: 3:05 PM     Patient Name: Hunter Myers       Medical Record Number: [id number]  Date of Birth: July 13, 1946 Sex: Male         Room/Bed: 4W13C/4W13C-01 Payor Info: Payor: MEDICARE / Plan: MEDICARE PART A AND B / Product Type: *No Product type* /    Admitting Diagnosis: major mult trauma  [facility]  WITH RESP FAILURE TRACH   Admit Date/Time:  07/09/2014  3:47 PM Admission Comments: No comment available   Primary Diagnosis:  TBI (traumatic brain injury) Principal Problem: TBI (traumatic brain injury)    Patient Active Problem List     Diagnosis  Date Noted   .  Multiple fractures of cervical spine  07/09/2014   .  Dysphagia, pharyngoesophageal phase     .  Severe malnutrition     .  Acute respiratory failure  06/09/2014   .  TBI (traumatic brain injury)  06/09/2014   .  HCAP (healthcare-associated pneumonia)  06/09/2014   .  C5 vertebral fracture  06/09/2014   .  Acute on chronic respiratory failure  06/09/2014   .  Tracheostomy status  06/09/2014      Expected Discharge Date: Expected Discharge Date: 08/01/14  Team Members Present: Physician leading conference: Dr. Faith Rogue Social Worker Present: Amada Jupiter, LCSW Nurse Present: Kennon Portela, RN PT Present: Cyndia Skeeters, Scot Jun, PT OT Present: Ardis Rowan, COTA;Jennifer Fredrich Romans, OT SLP Present: Feliberto Gottron, SLP PPS Coordinator present : Tora Duck, RN, CRRN        Current Status/Progress  Goal  Weekly Team Focus   Medical     slow progress. still in collar  and tlso. new xrays today. remains npo  improve focus, attention  swallowing trials, ?stimulant   Bowel/Bladder     Continent of bladder with max assist with urinal; incontinent of stool at times; LBM 11/15  Mod I for bowel and bladder  Offer toilet q2-3 hr and PRN   Swallow/Nutrition/ Hydration     NPO with PEG  Min A with least restrictive diet  Trials of Dys. 1 textures and nectar-thick liquids via tsp    ADL's     mod assist transfers, mod-total assists self-care tasks    min assist overall and supervision UB dressing   transfers, postural control in sitting and standing, balance, cognitive remediation, safety awareness, education, and activity tolerance    Mobility     min-modA transfers, modAx2 for ambulation and stairs   min A wheelchair level  cognitive remediation, activity tolerance, safety, education, postural control, balance, functional mobility   Communication     Dysphonic and is ~90% intellgibile    Supervision at sentence level   utilization of increased vocal intensity    Safety/Cognition/ Behavioral Observations    Max-Total A   Mod A  orientation, attention, awarenes, problem solving    Pain     Constant pain in neck with lidocaine patch in use and oxycodone 5mg  scheduled BID and oxycodone 5-10 mg q4hr PRN  </=3  Offer pain medication prior to therapy and PRN; monitor for nonverbal signs of pain   Skin     Dry/flaky skin on bilateral heels with eucerin in use;  blanchable redness to sacrum with allevyn and air mattress; healing trach site  No new skin breakdown  Monitor skin qshift; turn q2hr in bed and q1hr in chair       *See Care Plan and progress notes for long and short-term goals.    Barriers to Discharge:  cognition, safety     Possible Resolutions to Barriers:   see prior, NMR     Discharge Planning/Teaching Needs:   home with wife and daughter to provide 24/7 vs possible change to SNF       Team Discussion:    Confirmed pt has several more weeks to wear collar.  FEES today with some improvement but still NPO except with ST.  Most goals downgraded to min assist at w/c level.  Concern that wife will not be able to manage him at home - SW to follow up.   Revisions to Treatment Plan:    Many goals downgraded.  Possible change of d/c plan to SNF    Continued Need for Acute Rehabilitation Level of Care: The patient requires daily medical management by a physician with specialized training in physical medicine and rehabilitation for the following conditions: Daily direction of a multidisciplinary physical rehabilitation program to ensure safe treatment while eliciting the highest outcome that is of practical value to the patient.: Yes Daily medical management of patient stability for increased activity during participation in an intensive rehabilitation regime.: Yes Daily analysis of laboratory values and/or radiology reports with any subsequent need for medication adjustment of medical intervention for : Pulmonary problems;Post surgical problems;Neurological problems  Christmas Faraci 07/23/2014, 2:05 PM                 Amada JupiterLucy Cloey Sferrazza, LCSW Social Worker Signed  Patient Care Conference 07/14/2014  6:07 PM    Expand All Collapse All   Inpatient RehabilitationTeam Conference and Plan of Care Update Date: 07/14/2014   Time: 3:20 PM     Patient  Name: Hunter Myers       Medical Record Number: [id number]  Date of Birth: 07/27/1946 Sex: Male          Room/Bed: 4W13C/4W13C-01 Payor Info: Payor: MEDICARE / Plan: MEDICARE PART A AND B / Product Type: *No Product type* /    Admitting Diagnosis: major mult trauma  [facility]  WITH RESP FAILURE TRACH   Admit Date/Time:  07/09/2014  3:47 PM Admission Comments: No comment available   Primary Diagnosis:  TBI (traumatic brain injury) Principal Problem: TBI (traumatic brain injury)    Patient Active Problem List     Diagnosis  Date Noted   .  Multiple fractures of cervical spine  07/09/2014   .  Dysphagia, pharyngoesophageal phase     .  Severe malnutrition     .  Acute respiratory failure  06/09/2014   .  TBI (traumatic brain injury)  06/09/2014   .  HCAP (healthcare-associated pneumonia)  06/09/2014   .  C5 vertebral fracture  06/09/2014   .  Acute on chronic respiratory failure  06/09/2014   .  Tracheostomy status  06/09/2014     Expected Discharge Date: Expected Discharge Date: 08/01/14  Team Members Present: Physician leading conference: Dr. Faith RogueZachary Swartz Social Worker Present: Amada JupiterLucy Dameon Soltis, LCSW Nurse Present: Carlean PurlMaryann Barbour, RN PT Present: Zerita Boersaroline King, PT OT Present: Ardis Rowanom Lanier, COTA;Jennifer Fredrich RomansSmith, OT;Kayla Perkinson, OT SLP Present: Feliberto Gottronourtney Payne, SLP PPS Coordinator present : Tora DuckMarie Noel, RN, CRRN        Current Status/Progress  Goal  Weekly Team Focus   Medical     hx of tbi/neck trauma, respiratory failure. trach out  sleep/behavior restoration  neck/trach removal. ?collar liberation    Bowel/Bladder     Incontinent of bowel, loose bowel movements at times, LBM 11/9; incontinent of urine at times with condom catheter in use  Manage bowel and bladder with briefs and max assist   Offer toilet q2-3 hours and PRN   Swallow/Nutrition/ Hydration     NPO with PEG  Min A with least restrictive diet  Trials of ice chips, pharyngeal strengthening exercises    ADL's     max-total assist overall   min assist overall and supervision UB dressing   transfers, sitting and  standing balance, cognitive remediation, postural control, safety awarenss, activity tolerance    Mobility     Max-Ax2 persons overall  Supevision w/c level and cognition; Min A standing level   cognitive remediation, postural control, safety awareness, activity tolerance, transfers, balance, ambulation, w/c propulsion    Communication     Patient has dysphonia but is ~90% intelligible  No Goals set at this time  N/A   Safety/Cognition/ Behavioral Observations    Max-total A   Min A  orientation, attention intellectual awareness    Pain     Complains of pain in neck and BLE managed with oxycodone 5 mg scheduled BID and oxycodone 5-10 mg q4hr PRN   </=3  Offer pain medication prior to therapy and PRN   Skin     Dry/flaky skin on bilateral heels with eucerin in use; blanchable redness to sacrum with allevyn and air mattress in use; healing trach site    No new skin breakdown  Turn patient q2hr in bed and boost q1hr in wheelchair     Rehab Goals Patient on target to meet rehab goals: Yes *See Care Plan and progress notes for long and short-term goals.    Barriers to Discharge:  cognition/safety  Possible Resolutions to Barriers:   supervision, cognitive remediation,routine     Discharge Planning/Teaching Needs:   home with wife and daughtet to provide 24/7 assistance        Team Discussion:    Janina Mayo out and MD checking if we might be able to d/c collar.  Need to see if TLSO brace and be shaved down.  Supervision/ min goals overall.  Still NPO with peg.  Cognition slowly improving/.   Revisions to Treatment Plan:    None    Continued Need for Acute Rehabilitation Level of Care: The patient requires daily medical management by a physician with specialized training in physical medicine and rehabilitation for the following conditions: Daily direction of a multidisciplinary physical rehabilitation program to ensure safe treatment while eliciting the highest outcome that is of practical  value to the patient.: Yes Daily medical management of patient stability for increased activity during participation in an intensive rehabilitation regime.: Yes Daily analysis of laboratory values and/or radiology reports with any subsequent need for medication adjustment of medical intervention for : Neurological problems;Post surgical problems;Pulmonary problems  Deja Kaigler 07/14/2014, 6:07 PM

## 2014-08-04 NOTE — Progress Notes (Signed)
Physical Therapy Session Note  Patient Details  Name: Hunter Myers MRN: [id number] Date of Birth: 05/30/1946  Today's Date: 08/04/2014 PT Individual Time: 1530-1615 PT Individual Time Calculation (min): 45 min   Short Term Goals: Week 4:  PT Short Term Goal 1 (Week 4): STGs=LTGs  Skilled Therapeutic Interventions/Progress Updates:    Patient received sitting in wheelchair. Session focused on toileting, wheelchair mobility in controlled and home environments, furniture transfers, bed<>wheelchair transfers, bed mobility; patient performing at overall supervision level except min guard for standing to urinate. Patient does not require cues for set up of transfers, supervision for safety with balance. Patient left sitting at RN station with seatbelt donned.  Therapy Documentation Precautions:  Precautions Precautions: Cervical, Fall, Back Precaution Comments: PEG Required Braces or Orthoses: Spinal Brace, Cervical Brace Cervical Brace: Hard collar Spinal Brace: Thoracolumbosacral orthotic Restrictions Weight Bearing Restrictions: No Pain: Pain Assessment Pain Assessment: No/denies pain Pain Score: 0-No pain Locomotion : Wheelchair Mobility Distance: 150   See FIM for current functional status  Therapy/Group: Individual Therapy  Chipper HerbBridget S Suzi Hernan S. Daisy Mcneel, PT, DPT 08/04/2014, 5:06 PM

## 2014-08-04 NOTE — Progress Notes (Signed)
Social Work Patient ID: Hunter Myers, male   DOB: 07/03/1946, 68 y.o.   MRN: [id number]   [facility]  Discussed with pt and family - all accepted offer with plans to transfer to facility tomorrow.  Have alerted tx team.  Will transport via ambulance.  Zia Najera, LCSW

## 2014-08-04 NOTE — Plan of Care (Signed)
Problem: RH BOWEL ELIMINATION Goal: RH STG MANAGE BOWEL WITH ASSISTANCE STG Manage Bowel with Assistance. Mod I  Outcome: Progressing Goal: RH STG MANAGE BOWEL W/MEDICATION W/ASSISTANCE STG Manage Bowel with Medication with Assistance. Mod I  Outcome: Progressing  Problem: RH BLADDER ELIMINATION Goal: RH STG MANAGE BLADDER WITH ASSISTANCE STG Manage Bladder With Assistance. Mod I  Outcome: Progressing  Problem: RH SKIN INTEGRITY Goal: RH STG SKIN FREE OF INFECTION/BREAKDOWN Remain free form skin breakdown while on Rehab with Min assist  Outcome: Progressing Goal: RH STG MAINTAIN SKIN INTEGRITY WITH ASSISTANCE STG Maintain Skin Integrity With mod Assistance.  Outcome: Progressing  Problem: RH SAFETY Goal: RH STG ADHERE TO SAFETY PRECAUTIONS W/ASSISTANCE/DEVICE STG Adhere to Safety Precautions With Assistance/Device. Supervision  Outcome: Progressing Goal: RH STG DECREASED RISK OF FALL WITH ASSISTANCE STG Decreased Risk of Fall With max Assistance.  Outcome: Progressing  Problem: RH PAIN MANAGEMENT Goal: RH STG PAIN MANAGED AT OR BELOW PT'S PAIN GOAL Pain level less than 3  Outcome: Progressing

## 2014-08-04 NOTE — Progress Notes (Signed)
Recreational Therapy Session Note  Patient Details  Name: Hunter Myers MRN: [id number] Date of Birth: 07/27/1946 Today's Date: 08/04/2014  Pain: no c/o Skilled Therapeutic Interventions/Progress Updates: Session focused on activity tolerance, standing tolerance, dynamic standing balance with & without UE support, maintaining precautions, safety awareness & problem solving.  Pt stood without UE to put together pieces of Christmas tree with close supervision-min assist for balance & then to arrange branches on the tree. Pt initiated & then utilized written instructions to problem solve potential reasons why the lights were not working with Mod I.  Therapy/Group: Co-Treatment   Delvina Mizzell 08/04/2014, 4:20 PM

## 2014-08-04 NOTE — Patient Care Conference (Signed)
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 Multiple fractures of cervical spine 07/09/2014  . Dysphagia, pharyngoesophageal phase   . Severe malnutrition   . Acute respiratory failure 06/09/2014  . 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 HCAP (healthcare-associated pneumonia) 06/09/2014  . C5 vertebral fracture 06/09/2014  . Acute on chronic respiratory failure 06/09/2014  . Tracheostomy status 06/09/2014    Expected Discharge Date: Expected Discharge Date: 08/05/14 (to SNF)  Team Members Present: Physician leading conference: Dr. Faith RogueZachary Swartz Social Worker Present: Amada JupiterLucy Davari Lopes, LCSW Nurse Present: Ronny BaconWhitney Reardon, RN PT Present: Cyndia SkeetersBridgett Ripa, Scot JunPT;Caroline King, PT OT Present: Ardis Rowanom Lanier, COTA;Jennifer Katrinka BlazingSmith, OT SLP Present: Feliberto Gottronourtney Payne, SLP PPS Coordinator present : Tora DuckMarie Noel, RN, CRRN     Current Status/Progress Goal Weekly Team Focus  Medical   improved insight and awareness  finalize dc planning  finalize dc planning   Bowel/Bladder   continent of bowel and bladder with mod assist  Mod I for bowel bladder  offer toilet q2-3 hr and PRN   Swallow/Nutrition/ Hydration   Dys. 1 textures with nectar-thick liquids via spoon, Min A  Mod A  with least restrictive diet  D/C to SNF tomorrow    ADL's   min assist overall; min-supervision transfers   min assist overall, supervision UB dressing and toilet transfers  transfers, standing balance, cognitive remediation, safety awareness, education   Mobility   supervision-min A with transfers, min A with ambulation and stairs, and supervision with wheelchair   LTGs upgraded supervision for t/f, w/c propulsion, and bed mobility; min A with standing balance, ambulation, and negotiation of stairs  cognitive remediation, functional mobility, balance, postural control, activity tolerance, safety awareness, and education   Communication   Min A  Min A at sentence level   D/C to SNF placement   Safety/Cognition/ Behavioral Observations  Min-Mod A  Mod A  D/C to SNF tomorrow    Pain   neck/back pain relieved with lidoderm patch and scheduled and prn doses of oxycodone  pain equal to or lesss than 3 on scale of 0-10  assess pain q shift and prn   Skin   Dry skin to bilateral heels, blanchable skin to sacrum, healing trach site  No new skin injury/breakdown  assess skin q shift and prn, turn q2h in bed, reposition q1h in chair    Rehab Goals Patient on target to meet rehab goals: Yes *See Care Plan and progress notes for long and short-term goals.  Barriers to Discharge: braces, safety    Possible Resolutions to Barriers:  supervision, continued therapy    Discharge Planning/Teaching Needs:  To d/c to SNF tomorrow  Team Discussion:  Ready for d/c to SNF tomorrow.  Need to change meds to pill form.  Good gains in cognition this week.  Revisions to Treatment Plan:  None   Continued Need for Acute Rehabilitation Level of Care: The patient requires daily medical management by a physician with specialized training in physical medicine and rehabilitation for the following conditions: Daily direction of a multidisciplinary physical rehabilitation program to ensure safe treatment while  eliciting the highest outcome that is of practical value to the patient.: Yes Daily medical management of patient stability for increased activity during participation in an intensive rehabilitation regime.: Yes Daily analysis of laboratory values and/or radiology reports with any subsequent need for medication adjustment of medical intervention for : Post surgical problems;Neurological problems  Hunter Myers 08/04/2014, 4:43 PM

## 2014-08-04 NOTE — Plan of Care (Signed)
Problem: RH Swallowing Goal: LTG Patient will consume least restrictive PO diet (SLP) LTG: Patient will consume least restrictive PO diet with assist for use of compensatory strategies (SLP)  Outcome: Completed/Met Date Met:  08/04/14 Goal: LTG Patient will participate in dysphagia therapy (SLP) LTG: Patient will participate in dysphagia therapy with assist to increase swallow function as evidenced by bedside or objective clinical assessment (SLP)  Outcome: Completed/Met Date Met:  08/04/14 Goal: LTG Patient will demonstrate a functional change in (SLP) LTG: Patient will demonstrate a functional change in oral/oropharyngeal swallow as evidenced by an objective assessment (SLP)  Outcome: Completed/Met Date Met:  08/04/14  Problem: RH Cognition - SLP Goal: RH LTG Patient will demonstrate orientation with cues LTG: Patient will demonstrate orientation to person/place/time/situation with cues (SLP)  Outcome: Completed/Met Date Met:  08/04/14  Problem: RH Expression Communication Goal: LTG Patient will increase speech intelligibility (SLP) LTG: Patient will increase speech intelligibility at word/phrase/conversation level with cues, % of the time (SLP)  Outcome: Completed/Met Date Met:  08/04/14  Problem: RH Problem Solving Goal: LTG Patient will demonstrate problem solving for (SLP) LTG: Patient will demonstrate problem solving for basic/complex daily situations with cues (SLP)  Outcome: Completed/Met Date Met:  08/04/14  Problem: RH Attention Goal: LTG Patient will demonstrate focused/sustained (SLP) LTG: Patient will demonstrate focused/sustained/selective/alternating/divided attention during cognitive/linguistic activities in specific environment with assist for # of minutes (SLP)  Outcome: Completed/Met Date Met:  08/04/14  Problem: RH Awareness Goal: LTG: Patient will demonstrate intellectual/emergent (SLP) LTG: Patient will demonstrate intellectual/emergent/anticipatory awareness  with assist during a cognitive/linguistic activity (SLP)  Outcome: Completed/Met Date Met:  08/04/14

## 2014-08-04 NOTE — Progress Notes (Signed)
Physical Therapy Discharge Summary  Patient Details  Name: Hunter Myers MRN: [id number] Date of Birth: [birth year]  Today's Date: 08/04/2014 PT Individual Time: AM Session: 0900-1000  PM Session: [id number]  PT Individual Total Treatment Time: 60 min and 45 min   Patient has met 13 of 13 long term goals due to improved activity tolerance, improved balance, improved postural control, increased strength, ability to compensate for deficits, increased functional use of  right lower extremity, improved attention, improved awareness and improved coordination.  Patient to discharge at a wheelchair level Supervision and min A ambulatory level with RW. Family education not performed due to patient's discharge to skilled nursing facility.  Reasons goals not met: All goals met  Recommendation:  Patient will benefit from ongoing skilled PT services in skilled nursing facility setting to continue to advance safe functional mobility, address ongoing impairments in functional endurance, decreased balance, decreased postural control, decreased safety awareness, and minimize fall risk.  Equipment: No equipment provided  Reasons for discharge: treatment goals met and discharge from hospital  Patient/family agrees with progress made and goals achieved: Yes   Skilled intervention: AM Session: Pt received in room with nurse. Session focused on functional mobility and endurance, and NMR with focus on dynamic sitting and standing balance. W/c propulsion 150' in hospital hallway (room>rehab gym) using BUE and BLE with supervision and min verbal cues for advancement of RLE. Pt ambulated 250' in hospital hallway with RW and min A, min verbal cues for increased step-length of RLE, increased BOS, and selective attention to task; pt displays most deficits with ambulation when attention is divided. Pt negotiated 10 steps with step-to pattern and B handrails with min A and min verbal cues for sequencing and technique. NMR  with focus on postural control and balance in sitting and standing with following activities: -Standing reach and toss activity with low-level reach anteriorly and to R to promote weight shift and challenge dynamic standing balance. Pt used RW for UE support and required supervision, min verbal cues to promote equal weight bearing.   -Ball tossing activity while seated EOM with small red weighted ball, progressing task difficulty to challenge dynamic sitting balance and postural control through changing stable seating surface>seated on red air disk, feet support>unsupported, and additional cognitive counting task to divide attention.  Pt left at nurse's station with pink safety belt on.  PM Session 1: Pt received with SLP finishing prior session. Session focused on cognitive remediation, functional balance, and standing tolerance. Pt performed therapeutic activity with recreational therapy setting up a christmas tree. Pt required to stand and reach, maintain sustained>selective attention, and manipulate objects in standing at various heights for tree assembly. Pt becoming distracted and perturbed at end of session due to complaints of being "hot", pt was left sitting up in w/c in dayroom with pink safety belt in place and nurse secretary made aware of pt location.    PT Discharge Precautions/Restrictions Precautions Precautions: Cervical;Fall;Back Precaution Comments: PEG Required Braces or Orthoses: Spinal Brace;Cervical Brace Cervical Brace: Hard collar Spinal Brace: Thoracolumbosacral orthotic Restrictions Weight Bearing Restrictions: No Pain Pain Assessment Pain Assessment: No/denies pain Vision/Perception  Vision - Assessment Eye Alignment: Within Functional Limits Ocular Range of Motion: Within Functional Limits Tracking/Visual Pursuits: Able to track stimulus in all quads without difficulty Saccades: Within functional limits Convergence: Within functional limits  Cognition Overall  Cognitive Status: Impaired/Different from baseline Arousal/Alertness: Awake/alert Orientation Level: Oriented X4 Attention: Selective Focused Attention: Appears intact Sustained Attention: Appears intact Sustained Attention Impairment: Verbal basic;Functional  basic Selective Attention: Impaired Selective Attention Impairment: Verbal basic;Functional basic Memory: Impaired Memory Impairment: Decreased recall of new information;Decreased short term memory Decreased Long Term Memory: Verbal basic;Functional basic Decreased Short Term Memory: Verbal basic;Functional basic Awareness: Impaired Awareness Impairment: Emergent impairment Problem Solving: Impaired Problem Solving Impairment: Verbal basic;Functional basic Behaviors: Impulsive Safety/Judgment: Impaired [employer] Scales of Cognitive Functioning: Automatic/appropriate Sensation Sensation Light Touch: Appears Intact Stereognosis: Not tested Hot/Cold: Appears Intact Proprioception: Appears Intact Coordination Gross Motor Movements are Fluid and Coordinated: No Fine Motor Movements are Fluid and Coordinated: Yes Motor  Motor Motor: Abnormal postural alignment and control Motor - Skilled Clinical Observations: overall generalized weakness Motor - Discharge Observations: RLE weakness  Mobility Bed Mobility Bed Mobility: Supine to Sit;Sit to Supine Rolling Right: 5: Supervision Rolling Right Details: Verbal cues for technique;Verbal cues for precautions/safety Supine to Sit: 5: Supervision Supine to Sit Details: Verbal cues for precautions/safety;Verbal cues for technique Sit to Supine: 5: Supervision Sit to Supine - Details: Verbal cues for precautions/safety;Verbal cues for technique Transfers Transfers: Yes Sit to Stand: 5: Supervision;4: Min guard Sit to Stand Details: Verbal cues for precautions/safety;Verbal cues for technique Stand to Sit: 5: Supervision Stand to Sit Details (indicate cue type and reason):  Verbal cues for precautions/safety;Verbal cues for technique Stand Pivot Transfers: 4: Min guard Stand Pivot Transfer Details: Verbal cues for technique;Verbal cues for precautions/safety Locomotion  Ambulation Ambulation: Yes Ambulation/Gait Assistance: 4: Min guard Ambulation Distance (Feet): 250 Feet Assistive device: Rolling walker and R AFO Ambulation/Gait Assistance Details: Verbal cues for precautions/safety;Verbal cues for technique;Verbal cues for gait pattern Gait Gait: Yes Gait Pattern: Impaired Gait Pattern: Narrow base of support;Poor foot clearance - right;Decreased dorsiflexion - right;Decreased step length - right Stairs / Additional Locomotion Stairs: Yes Stairs Assistance: 4: Min assist;4: Min guard Stairs Assistance Details: Verbal cues for technique;Verbal cues for precautions/safety;Verbal cues for sequencing Stair Management Technique: Two rails;Step to pattern Number of Stairs: 5 Height of Stairs: 6 Wheelchair Mobility Wheelchair Mobility: Yes Wheelchair Assistance: 5: Supervision Wheelchair Assistance Details: Verbal cues for technique;Verbal cues for Information systems manager: Both upper extremities;Both lower extermities Wheelchair Parts Management: Supervision/cueing Distance: 150  Trunk/Postural Assessment  Cervical Assessment Cervical Assessment: Exceptions to [facility] Thoracic Assessment Thoracic Assessment: Exceptions to [facility] Lumbar Assessment Lumbar Assessment: Exceptions to [facility] Postural Control Postural Control: Deficits on evaluation Righting Reactions: delayed Postural Limitations: posterior pelvic tilt in sitting  Balance Balance Balance Assessed: Yes Static Sitting Balance Static Sitting - Balance Support: Feet supported Static Sitting - Level of Assistance: 5: Stand by assistance Dynamic Sitting Balance Dynamic Sitting - Balance Support: Feet supported;During functional activity Dynamic Sitting - Level of Assistance: 5:  Stand by assistance Dynamic Sitting - Balance Activities: [employer];Reaching for objects Static Standing Balance Static Standing - Balance Support: Bilateral upper extremity supported;During functional activity Static Standing - Level of Assistance: 5: Stand by assistance Extremity Assessment  RLE Assessment RLE Assessment: Within Functional Limits RLE Strength RLE Overall Strength: Deficits LLE Assessment LLE Assessment: Within Functional Limits LLE Strength LLE Overall Strength: Deficits  See FIM for current functional status  Dea Bitting 08/06/2014, 4:41 PM

## 2014-08-04 NOTE — Progress Notes (Signed)
Occupational Therapy Session Note  Patient Details  Name: Hunter Myers MRN: [id number] Date of Birth: 10/24/1945  Today's Date: 08/04/2014 OT Individual Time: 1100-1200 OT Individual Time Calculation (min): 60 min    Short Term Goals: Week 4:  OT Short Term Goal 1 (Week 4): Focus on LTGs  Skilled Therapeutic Interventions/Progress Updates:    Pt engaged in BADL retraining including bathing and dressing with sit<>stand from EOB .  Pt required min A for LB bathing and dressing.  Pt used AE appropriately for LB dressing.  Pt performed squat pivot transfer w/c<>bed with supervision.  Focus on activity tolerance, sit<>stand, standing balance, transfers, and safety awareness.  Therapy Documentation Precautions:  Precautions Precautions: Cervical, Fall, Back Precaution Comments: PEG Required Braces or Orthoses: Spinal Brace, Cervical Brace Cervical Brace: Hard collar Spinal Brace: Thoracolumbosacral orthotic Restrictions Weight Bearing Restrictions: No Pain: Pain Assessment Pain Assessment: No/denies pain  See FIM for current functional status  Therapy/Group: Individual Therapy  Rich BraveLanier, Candace Ramus Chappell 08/04/2014, 12:11 PM

## 2014-08-05 NOTE — Progress Notes (Signed)
[facility] PHYSICAL MEDICINE & REHABILITATION     PROGRESS NOTE    Subjective/Complaints: No new complaints.  ROS limited by mental status  Objective: Vital Signs: Blood pressure 121/77, pulse 70, temperature 97.9 F (36.6 C), temperature source Oral, resp. rate 16, height 5\' 10"  (1.778 m), weight 55.339 kg (122 lb), SpO2 99 %. No results found.  Recent Labs  08/04/14 0235  WBC 5.7  HGB 10.9*  HCT 34.5*  PLT 233    Recent Labs  08/04/14 0235  NA 141  K 4.2  CL 102  GLUCOSE 70  BUN 18  CREATININE 0.63  CALCIUM 9.2   CBG (last 3)   Recent Labs  08/03/14 2111 08/04/14 0641  GLUCAP 139* 80    Wt Readings from Last 3 Encounters:  08/05/14 55.339 kg (122 lb)  07/09/14 58.514 kg (129 lb)    Physical Exam:   Constitutional: He appears well-developed.   Cervical collar in place HENT: Voice is hoarse but air leakage through trach, Head: Normocephalic and atraumatic.  Eyes: Conjunctivae are normal. Pupils are equal, round, and reactive to light.  Neck: air escaping from stoma--area clean. Cardiovascular: Regular rhythm.  Respiratory: Effort normal and breath sounds normal. No respiratory distress. He has no wheezes.  GI: Soft. Bowel sounds are normal. He exhibits no distension.  PEG site clean with minimal drainage.  Musculoskeletal: He exhibits no edema.  Unable to raise left shoulder.  Neurological: He is alert.  Oriented to self and place. Still with decreased awareness and insight. Less Restless. dysphonia persists but better     3+ prox to 4/5 distally in UE's. LE: Left HF 3/5. RHF 2/5. 3 minus bilateral ankle dorsiflexors Skin: Skin is warm and dry. Mild erythema around the PICC site right medial arm      Assessment/Plan: 1. 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 Physiatrist is  providing close team supervision and 24 hour management of active medical problems listed below. Physiatrist and rehab team continue to assess barriers to discharge/monitor patient progress toward functional and medical goals.  To SNF today. Pt has made nice progress. Still requires supervision for safety,balance,cognition  FIM: FIM - Bathing Bathing Steps Patient Completed: Chest, Right Arm, Left Arm, Abdomen, Front perineal area, Right upper leg, Left upper leg, Left lower leg (including foot), Right lower leg (including foot), Buttocks Bathing: 4: Steadying assist  FIM - Upper Body Dressing/Undressing Upper body dressing/undressing steps patient completed: Thread/unthread right sleeve of front closure shirt/dress, Thread/unthread left sleeve of front closure shirt/dress, Button/unbutton shirt Upper body dressing/undressing: 5: Set-up assist to: Obtain clothing/put away FIM - Lower Body Dressing/Undressing Lower body dressing/undressing steps patient completed: Thread/unthread left pants leg, Pull pants up/down, Don/Doff right sock, Don/Doff left sock, Thread/unthread right pants leg, Don/Doff left shoe, Fasten/unfasten left shoe, Thread/unthread right underwear leg, Thread/unthread left underwear leg, Pull underwear up/down Lower body dressing/undressing: 4: Min-Patient completed 75 plus % of tasks  FIM - Toileting Toileting steps completed by patient: Adjust clothing prior to toileting, Performs perineal hygiene Toileting Assistive Devices: Grab bar or rail for support Toileting: 3: Mod-Patient completed 2 of 3 steps  FIM - Diplomatic Services operational officerToilet Transfers Toilet Transfers Assistive Devices: Grab bars Toilet Transfers: 4-From toilet/BSC: Min A (steadying Pt. > 75%), 4-To toilet/BSC: Min A (steadying Pt. > 75%)  FIM - Bed/Chair Transfer Bed/Chair Transfer Assistive Devices: Walker, Arm rests Bed/Chair Transfer: 5:  Bed > Chair or W/C: Supervision (verbal cues/safety issues), 5: Chair or W/C > Bed:  Supervision (verbal cues/safety issues), 5: Supine > Sit: Supervision (verbal cues/safety issues), 5: Sit > Supine: Supervision (verbal cues/safety issues)  FIM - Locomotion: Wheelchair Distance: 150 Locomotion: Wheelchair: 5: Travels 150 ft or more: maneuvers on rugs and over door sills with supervision, cueing or coaxing FIM - Locomotion: Ambulation Locomotion: Ambulation Assistive Devices: Environmental consultantWalker - Rolling, Orthosis (AFO) Ambulation/Gait Assistance: 4: Min guard Locomotion: Ambulation: 0: Activity did not occur  Comprehension Comprehension Mode: Auditory Comprehension: 4-Understands basic 75 - 89% of the time/requires cueing 10 - 24% of the time  Expression Expression Mode: Verbal Expression: 5-Expresses basic needs/ideas: With extra time/assistive device  Social Interaction Social Interaction Mode: Asleep Social Interaction: 5-Interacts appropriately 90% of the time - Needs monitoring or encouragement for participation or interaction.  Problem Solving Problem Solving Mode: Asleep Problem Solving: 4-Solves basic 75 - 89% of the time/requires cueing 10 - 24% of the time  Memory Memory Mode: Asleep Memory: 3-Recognizes or recalls 50 - 74% of the time/requires cueing 25 - 49% of the time Medical Problem List and Plan: 1. Functional deficits secondary to TBI, cervical and thoracic spine fractures.   -Surgery requests that pt be in [place] for at least 3 months. Can be up to 30 degrees without TLSO  -cervical xr without fx but demineralization and diffuse facet disease  -thoracic films display fractures/l1 end plate fx  2. DVT Prophylaxis/Anticoagulation: Pharmaceutical: Lovenox 3. Pain Management: Will continue oxycodone bid with prn doses as needed. Lidocaine patch for neck pain  -gabapentin for feet 4. H/o anxiety disorder/Mood: Will continue xanax prn for now. Patient has poor insight and lacks awareness of deficits. LCSW to follow along for support and evaluation  when indicated.  5. Neuropsych: This patient is not capable of making decisions on his own behalf.  -seems to be doing better with seroquel hs---increased to 50 which helped further 6. Skin/Wound Care: Pressure relief measures. Continue air mattress overlay.  Eucerin cream for dry skin bilateral feet.  7. Fluids/Electrolytes/Nutrition: Monitor I/O. Electrolytes/bun/cr all look reasonable  -off bolus feeds entirely  -continue 100cc h20 flushes until liquids are increased to regular/thin 8. ABLA:  hgb 11.5 9. Pulmonary: trach out. Stoma closed.   10.  Severe dysphagia,   -D1 diet with nectars     LOS (Days) 27 A FACE TO FACE EVALUATION WAS PERFORMED  Lyrika Souders T 08/05/2014 8:08 AM

## 2014-08-05 NOTE — Plan of Care (Signed)
Problem: RH Balance Goal: LTG: Patient will maintain dynamic sitting balance (OT) LTG: Patient will maintain dynamic sitting balance with assistance during activities of daily living (OT)  Outcome: Completed/Met Date Met:  08/05/14 Goal: LTG Patient will maintain dynamic standing with ADLs (OT) LTG: Patient will maintain dynamic standing balance with assist during activities of daily living (OT)  Outcome: Completed/Met Date Met:  08/05/14  Problem: RH Eating Goal: LTG Patient will perform eating w/assist, cues/equip (OT) LTG: Patient will perform eating with assist, with/without cues using equipment (OT)  Outcome: Completed/Met Date Met:  08/05/14  Problem: RH Bathing Goal: LTG Patient will bathe with assist, cues/equipment (OT) LTG: Patient will bathe specified number of body parts with assist with/without cues using equipment (position) (OT)  Outcome: Completed/Met Date Met:  08/05/14  Problem: RH Dressing Goal: LTG Patient will perform upper body dressing (OT) LTG Patient will perform upper body dressing with assist, with/without cues (OT).  Outcome: Completed/Met Date Met:  08/05/14 Goal: LTG Patient will perform lower body dressing w/assist (OT) LTG: Patient will perform lower body dressing with assist, with/without cues in positioning using equipment (OT)  Outcome: Completed/Met Date Met:  08/05/14  Problem: RH Toileting Goal: LTG Patient will perform toileting w/assist, cues/equip (OT) LTG: Patient will perform toiletiing (clothes management/hygiene) with assist, with/without cues using equipment (OT)  Outcome: Completed/Met Date Met:  08/05/14  Problem: RH Functional Use of Upper Extremity Goal: LTG Patient will use RT/LT upper extremity as a (OT) LTG: Patient will use right/left upper extremity as a stabilizer/gross assist/diminished/nondominant/dominant level with assist, with/without cues during functional activity (OT)  Outcome: Completed/Met Date Met:   08/05/14  Problem: RH Toilet Transfers Goal: LTG Patient will perform toilet transfers w/assist (OT) LTG: Patient will perform toilet transfers with assist, with/without cues using equipment (OT)  Outcome: Completed/Met Date Met:  08/05/14  Problem: RH Memory Goal: LTG Patient will demonstrate ability for day to day (OT) LTG: Patient will demonstrate ability for day to day recall/carryover during activities of daily living with assist (OT)  Outcome: Completed/Met Date Met:  08/05/14  Problem: RH Awareness Goal: LTG: Patient will demonstrate intellectual/emergent (OT) LTG: Patient will demonstrate intellectual/emergent/anticipatory awareness with assist during a functional activity (OT)  Outcome: Completed/Met Date Met:  08/05/14

## 2014-08-05 NOTE — Progress Notes (Signed)
Social Work  Discharge Note  The overall goal for the admission was met for:   Discharge location: No - plan changed to SNF as wife unable to meet care needs  Length of Stay: No - slightly extended due to search for SNF.  LOS=27 days  Discharge activity level: Yes - minimal assistance overall  Home/community participation: Yes  Services provided included: MD, RD, PT, OT, SLP, RN, TR, Pharmacy, Neuropsych and SW  Financial Services: Medicare and Private Insurance: [place]  Follow-up services arranged: Other: SNF at [place]  Comments (or additional information):  Patient/Family verbalized understanding of follow-up arrangements: Yes  Individual responsible for coordination of the follow-up plan: patient and wife  Confirmed correct DME delivered: NA    Hunter Myers

## 2014-08-05 NOTE — Plan of Care (Signed)
Problem: RH BOWEL ELIMINATION Goal: RH STG MANAGE BOWEL WITH ASSISTANCE STG Manage Bowel with Assistance. Mod I  Outcome: Completed/Met Date Met:  08/05/14 Goal: RH STG MANAGE BOWEL W/MEDICATION W/ASSISTANCE STG Manage Bowel with Medication with Assistance. Mod I  Outcome: Completed/Met Date Met:  08/05/14  Problem: RH BLADDER ELIMINATION Goal: RH STG MANAGE BLADDER WITH ASSISTANCE STG Manage Bladder With Assistance. Mod I  Outcome: Completed/Met Date Met:  08/05/14  Problem: RH SKIN INTEGRITY Goal: RH STG MAINTAIN SKIN INTEGRITY WITH ASSISTANCE STG Maintain Skin Integrity With mod Assistance.  Outcome: Completed/Met Date Met:  08/05/14 Goal: RH STG ABLE TO PERFORM INCISION/WOUND CARE W/ASSISTANCE STG Able To Perform Incision/Wound Care With total Assistance.  Outcome: Not Applicable Date Met:  27/67/01  Problem: RH SAFETY Goal: RH STG DECREASED RISK OF FALL WITH ASSISTANCE STG Decreased Risk of Fall With max Assistance.  Outcome: Completed/Met Date Met:  08/05/14  Problem: RH PAIN MANAGEMENT Goal: RH STG PAIN MANAGED AT OR BELOW PT'S PAIN GOAL Pain level less than 3  Outcome: Completed/Met Date Met:  08/05/14

## 2014-08-05 NOTE — Plan of Care (Signed)
Problem: RH BOWEL ELIMINATION Goal: RH STG MANAGE BOWEL WITH ASSISTANCE STG Manage Bowel with Assistance. Mod I  Outcome: Progressing Goal: RH STG MANAGE BOWEL W/MEDICATION W/ASSISTANCE STG Manage Bowel with Medication with Assistance. Mod I  Outcome: Progressing  Problem: RH BLADDER ELIMINATION Goal: RH STG MANAGE BLADDER WITH ASSISTANCE STG Manage Bladder With Assistance. Mod I  Outcome: Progressing  Problem: RH SKIN INTEGRITY Goal: RH STG SKIN FREE OF INFECTION/BREAKDOWN Remain free form skin breakdown while on Rehab with Min assist  Outcome: Progressing Goal: RH STG MAINTAIN SKIN INTEGRITY WITH ASSISTANCE STG Maintain Skin Integrity With mod Assistance.  Outcome: Progressing Goal: RH STG ABLE TO PERFORM INCISION/WOUND CARE W/ASSISTANCE STG Able To Perform Incision/Wound Care With total Assistance.  Outcome: Progressing  Problem: RH SAFETY Goal: RH STG ADHERE TO SAFETY PRECAUTIONS W/ASSISTANCE/DEVICE STG Adhere to Safety Precautions With Assistance/Device. Supervision  Outcome: Progressing Goal: RH STG DECREASED RISK OF FALL WITH ASSISTANCE STG Decreased Risk of Fall With max Assistance.  Outcome: Progressing  Problem: RH PAIN MANAGEMENT Goal: RH STG PAIN MANAGED AT OR BELOW PT'S PAIN GOAL Pain level less than 3  Outcome: Progressing     

## 2014-08-05 NOTE — Plan of Care (Signed)
Problem: Consults Goal: RH BRAIN INJURY PATIENT EDUCATION Description: See Patient Education module for eduction specifics  Outcome: Completed/Met Date Met:  08/05/14

## 2014-08-05 NOTE — Progress Notes (Signed)
Social Work Patient ID: [name], male   DOB: 09/02/1946, 68 y.o.   MRN: [id number]   Pt d/c place on "hold" until tomorrow due to transport issues. Unfortunately, not aware until today [place] than [place] (pt would be charged $1200). Team feels only safe family member to transport pt would be daughter, however, she is working a 24 hour shift and cannot be here until tomorrow morning. Have alerted pt and MD/PA/RN.  Max Nuno, LCSW

## 2014-08-05 NOTE — Progress Notes (Signed)
Occupational Therapy Discharge Summary  Patient Details  Name: Hunter Myers MRN: [id number] Date of Birth: 1946-01-10     Patient has met 68 of 11 long term goals due to improved activity tolerance, improved balance, postural control, ability to compensate for deficits, functional use of  RIGHT upper, RIGHT lower, LEFT upper and LEFT lower extremity, improved attention, improved awareness and improved coordination.  Patient to discharge at [facility] Assist level.  [name] care partner not necessary to provide the necessary physical and cognitive assistance at discharge as patient is discharging to SNF.    Reasons goals not met: N/A. All LTGs met.  Recommendation:  Patient will benefit from ongoing skilled OT services in skilled nursing facility setting to continue to advance functional skills in the area of BADL.  Equipment: No equipment provided  Reasons for discharge: treatment goals met and discharge from hospital  Patient/family agrees with progress made and goals achieved: Yes  OT Discharge Precautions/Restrictions  Precautions Precautions: Cervical;Fall;Back Precaution Comments: PEG Required Braces or Orthoses: Spinal Brace;Cervical Brace Cervical Brace: Hard collar Spinal Brace: Thoracolumbosacral orthotic Restrictions Weight Bearing Restrictions: No General   Vital Signs Therapy Vitals Temp: 98 F (36.7 C) Temp Source: Oral Pulse Rate: 74 Resp: 18 BP: 121/78 mmHg Patient Position (if appropriate): Sitting Oxygen Therapy SpO2: 100 % O2 Device: Not Delivered Pain Pain Assessment Pain Assessment: No/denies pain ADL   Vision/Perception  Vision- History Baseline Vision/History: Cataracts Patient Visual Report: No change from baseline Vision- Assessment Vision Assessment?: No apparent visual deficits;Yes Eye Alignment: Within Functional Limits Ocular Range of Motion: Within Functional Limits Tracking/Visual Pursuits: Able to track stimulus in all  quads without difficulty Saccades: Within functional limits Convergence: Within functional limits Visual Fields: No apparent deficits  Cognition Overall Cognitive Status: Impaired/Different from baseline Arousal/Alertness: Awake/alert Orientation Level: Oriented X4 Attention: Selective Focused Attention: Appears intact Sustained Attention: Appears intact Selective Attention: Impaired Selective Attention Impairment: Verbal basic;Functional basic Memory: Impaired Memory Impairment: Decreased recall of new information;Decreased short term memory Decreased Short Term Memory: Verbal basic;Functional basic Awareness: Impaired Awareness Impairment: Emergent impairment Problem Solving: Impaired Problem Solving Impairment: Verbal basic;Functional basic Behaviors: Impulsive Safety/Judgment: Impaired [employer] Scales of Cognitive Functioning: Automatic/appropriate Sensation Sensation Light Touch: Appears Intact Hot/Cold: Appears Intact Proprioception: Appears Intact Coordination Gross Motor Movements are Fluid and Coordinated: No Fine Motor Movements are Fluid and Coordinated: Yes Motor  Motor Motor: Abnormal postural alignment and control Mobility  Transfers Transfers: Sit to Stand;Stand to Sit Sit to Stand: 4: Min assist Sit to Stand Details: Manual facilitation for weight shifting;Verbal cues for sequencing;Verbal cues for precautions/safety Stand to Sit: 4: Min assist Stand to Sit Details (indicate cue type and reason): Verbal cues for sequencing;Verbal cues for precautions/safety;Manual facilitation for weight shifting  Trunk/Postural Assessment  Cervical Assessment Cervical Assessment: Exceptions to [facility] (limited by cervical collar) Thoracic Assessment Thoracic Assessment: Exceptions to [facility] (TLSO) Lumbar Assessment Lumbar Assessment: Exceptions to WFL (TLSO)  Balance Balance Balance Assessed: Yes Static Sitting Balance Static Sitting - Balance Support: Feet  supported Static Sitting - Level of Assistance: 5: Stand by assistance Dynamic Sitting Balance Dynamic Sitting - Balance Support: Feet supported;During functional activity Dynamic Sitting - Level of Assistance: 5: Stand by assistance Static Standing Balance Static Standing - Balance Support: During functional activity Static Standing - Level of Assistance: 5: Stand by assistance Extremity/Trunk Assessment RUE Assessment RUE Assessment: Within Functional Limits (grossly 3+/5 strength; fatigues quickly) LUE Assessment LUE Assessment: Within Functional Limits (grossly 3+/5 strength, fatigues quickly)  See FIM  for current functional status  Hunter Myers, Hunter Myers 08/05/2014, 3:00 PM

## 2014-08-05 NOTE — Plan of Care (Signed)
Problem: RH SKIN INTEGRITY Goal: RH STG SKIN FREE OF INFECTION/BREAKDOWN Remain free form skin breakdown while on Rehab with Min assist  Outcome: Completed/Met Date Met:  08/05/14  Problem: RH SAFETY Goal: RH STG ADHERE TO SAFETY PRECAUTIONS W/ASSISTANCE/DEVICE STG Adhere to Safety Precautions With Assistance/Device. Supervision  Outcome: Completed/Met Date Met:  08/05/14

## 2014-08-06 NOTE — Progress Notes (Signed)
Recreational Therapy Discharge Summary Patient Details  Name: Hunter Myers MRN: [id number] Date of Birth: 01/09/1946 Today's Date: 08/06/2014  Long term goals set: 1  Long term goals met: 1  Comments on progress toward goals: Pt has made great progress toward goal & is ready for discharge to SNF today for 24 hour supervision/assist.  Pt met min assist level for moderately complex TR tasks standing.  Pt continues to require verbal cues for safety.  Reasons for discharge: discharge from hospital Patient/family agrees with progress made and goals achieved: Yes  [name] 08/06/2014, 11:55 AM

## 2014-08-06 NOTE — Progress Notes (Signed)
Social Work Patient ID: [name], male   DOB: 05/25/1946, 68 y.o.   MRN: [id number]   [facility]  See prior d/c note from 12/2.  Anthony Tamburo, LCSW

## 2014-08-06 NOTE — Plan of Care (Signed)
Problem: RH Balance Goal: LTG Patient will maintain dynamic sitting balance (PT) LTG: Patient will maintain dynamic sitting balance with assistance during mobility activities (PT)  Outcome: Completed/Met Date Met:  08/06/14 Goal: LTG Patient will maintain dynamic standing balance (PT) LTG: Patient will maintain dynamic standing balance with assistance during mobility activities (PT)  Outcome: Completed/Met Date Met:  08/06/14  Problem: RH Bed Mobility Goal: LTG Patient will perform bed mobility with assist (PT) LTG: Patient will perform bed mobility with assistance, with/without cues (PT).  Outcome: Completed/Met Date Met:  08/06/14  Problem: RH Bed to Chair Transfers Goal: LTG Patient will perform bed/chair transfers w/assist (PT) LTG: Patient will perform bed/chair transfers with assistance, with/without cues (PT).  Outcome: Completed/Met Date Met:  08/06/14  Problem: RH Furniture Transfers Goal: LTG Patient will perform furniture transfers w/assist (OT/PT LTG: Patient will perform furniture transfers with assistance (OT/PT).  Outcome: Completed/Met Date Met:  08/06/14  Problem: RH Ambulation Goal: LTG Patient will ambulate in controlled environment (PT) LTG: Patient will ambulate in a controlled environment, # of feet with assistance (PT).  Outcome: Completed/Met Date Met:  08/06/14  Problem: RH Wheelchair Mobility Goal: LTG Patient will propel w/c in controlled environment (PT) LTG: Patient will propel wheelchair in controlled environment, # of feet with assist (PT)  Outcome: Completed/Met Date Met:  08/06/14 Goal: LTG Patient will propel w/c in home environment (PT) LTG: Patient will propel wheelchair in home environment, # of feet with assistance (PT).  Outcome: Completed/Met Date Met:  08/06/14 Goal: LTG Patient will propel w/c in community environment (PT) LTG: Patient will propel wheelchair in community environment, # of feet with assist (PT)  Outcome: Completed/Met Date  Met:  08/06/14  Problem: RH Stairs Goal: LTG Patient will ambulate up and down stairs w/assist (PT) LTG: Patient will ambulate up and down # of stairs with assistance (PT)  Outcome: Completed/Met Date Met:  08/06/14  Problem: RH Memory Goal: LTG Patient demonstrate ability for day to day recall (PT) LTG: Patient will demonstrate ability for day to day recall/carryover during mobility activities with assist (PT)  Outcome: Completed/Met Date Met:  08/06/14  Problem: RH Awareness Goal: LTG: Patient will demonstrate intellectual/emergent (PT) LTG: Patient will demonstrate intellectual/emergent/anticipatory awareness with assist during a mobility activity (PT)  Outcome: Completed/Met Date Met:  08/06/14

## 2014-08-06 NOTE — Plan of Care (Signed)
Problem: RH Leisure Awareness Goal: LTG: Patient will participate in leisure activities (TR) LTG: Patient will participate in leisure activities (simple/moderate/difficult) to increase ability to functionally perform activity, identify and utilize resources, identify new leisure interests, utilize adaptive equipment at specific level (TR)  Outcome: Completed/Met Date Met:  08/06/14

## 2014-08-06 NOTE — H&P (Signed)
1024 Report called to Jerrell MylarJanna Lewis, [facility].  Patient transferred via daughter (w/ complimentary ambulance).  All personal items in tow.  No further questions asked.

## 2014-08-06 NOTE — Progress Notes (Signed)
[place] PHYSICAL MEDICINE & REHABILITATION     PROGRESS NOTE    Subjective/Complaints: Uneventful night. To SNF today.  ROS limited by mental status  Objective: Vital Signs: Blood pressure 121/71, pulse 64, temperature 97.5 F (36.4 C), temperature source Oral, resp. rate 18, height 5\' 10"  (1.778 m), weight 56.246 kg (124 lb), SpO2 98 %. No results found.  Recent Labs  08/04/14 0235  WBC 5.7  HGB 10.9*  HCT 34.5*  PLT 233    Recent Labs  08/04/14 0235  NA 141  K 4.2  CL 102  GLUCOSE 70  BUN 18  CREATININE 0.63  CALCIUM 9.2   CBG (last 3)   Recent Labs  08/03/14 2111 08/04/14 0641  GLUCAP 139* 80    Wt Readings from Last 3 Encounters:  08/06/14 56.246 kg (124 lb)  07/09/14 58.514 kg (129 lb)    Physical Exam:   Constitutional: He appears well-developed.   Cervical collar in place HENT: Voice is hoarse but air leakage through trach, Head: Normocephalic and atraumatic.  Eyes: Conjunctivae are normal. Pupils are equal, round, and reactive to light.  Neck: air escaping from stoma--area clean. Cardiovascular: Regular rhythm.  Respiratory: Effort normal and breath sounds normal. No respiratory distress. He has no wheezes.  GI: Soft. Bowel sounds are normal. He exhibits no distension.  PEG site clean with minimal drainage.  Musculoskeletal: He exhibits no edema.  Unable to raise left shoulder.  Neurological: He is alert.  Oriented to self and place. Still with decreased awareness and insight. Less Restless. dysphonia persists but better     3+ prox to 4/5 distally in UE's. LE: Left HF 3/5. RHF 2/5. 3 minus bilateral ankle dorsiflexors Skin: Skin is warm and dry. Mild erythema around the PICC site right medial arm      Assessment/Plan: 1. 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 Physiatrist is providing close team supervision and 24 hour management of active medical problems listed below. Physiatrist and rehab team continue to assess barriers to discharge/monitor patient progress toward functional and medical goals.  To SNF today.    FIM: FIM - Bathing Bathing Steps Patient Completed: Chest, Right Arm, Left Arm, Abdomen, Front perineal area, Right upper leg, Left upper leg, Left lower leg (including foot), Right lower leg (including foot), Buttocks Bathing: 4: Steadying assist  FIM - Upper Body Dressing/Undressing Upper body dressing/undressing steps patient completed: Thread/unthread right sleeve of front closure shirt/dress, Thread/unthread left sleeve of front closure shirt/dress, Button/unbutton shirt Upper body dressing/undressing: 5: Set-up assist to: Obtain clothing/put away FIM - Lower Body Dressing/Undressing Lower body dressing/undressing steps patient completed: Thread/unthread left pants leg, Pull pants up/down, Don/Doff right sock, Don/Doff left sock, Thread/unthread right pants leg, Don/Doff left shoe, Fasten/unfasten left shoe, Thread/unthread right underwear leg, Thread/unthread left underwear leg, Pull underwear up/down Lower body dressing/undressing: 4: Min-Patient completed 75 plus % of tasks  FIM - Toileting Toileting steps completed by patient: Adjust clothing prior to toileting, Performs perineal hygiene Toileting Assistive Devices: Grab bar or rail for support Toileting: 3: Mod-Patient completed 2 of 3 steps  FIM - Diplomatic Services operational officerToilet Transfers Toilet Transfers Assistive Devices: Grab bars Toilet Transfers: 4-From toilet/BSC: Min A (steadying Pt. > 75%), 4-To toilet/BSC: Min A (steadying Pt. > 75%)  FIM - Bed/Chair Transfer Bed/Chair Transfer Assistive Devices: Walker, Arm rests Bed/Chair Transfer: 5: Bed > Chair or W/C: Supervision (  verbal cues/safety issues), 5: Chair or W/C > Bed: Supervision (verbal cues/safety issues), 5: Supine > Sit:  Supervision (verbal cues/safety issues), 5: Sit > Supine: Supervision (verbal cues/safety issues)  FIM - Locomotion: Wheelchair Distance: 150 Locomotion: Wheelchair: 5: Travels 150 ft or more: maneuvers on rugs and over door sills with supervision, cueing or coaxing FIM - Locomotion: Ambulation Locomotion: Ambulation Assistive Devices: Environmental consultantWalker - Rolling, Orthosis (AFO) Ambulation/Gait Assistance: 4: Min guard Locomotion: Ambulation: 0: Activity did not occur  Comprehension Comprehension Mode: Auditory Comprehension: 5-Follows basic conversation/direction: With extra time/assistive device  Expression Expression Mode: Verbal Expression: 5-Expresses basic 90% of the time/requires cueing < 10% of the time.  Social Interaction Social Interaction Mode: Asleep Social Interaction: 5-Interacts appropriately 90% of the time - Needs monitoring or encouragement for participation or interaction.  Problem Solving Problem Solving Mode: Asleep Problem Solving: 4-Solves basic 75 - 89% of the time/requires cueing 10 - 24% of the time  Memory Memory Mode: Asleep Memory: 3-Recognizes or recalls 50 - 74% of the time/requires cueing 25 - 49% of the time Medical Problem List and Plan: 1. Functional deficits secondary to TBI, cervical and thoracic spine fractures.   -Surgery requests that pt be in [place] for at least 3 months. Can be up to 30 degrees without TLSO  -cervical xr without fx but demineralization and diffuse facet disease  -thoracic films display fractures/l1 end plate fx  2. DVT Prophylaxis/Anticoagulation: Pharmaceutical: Lovenox 3. Pain Management: Will continue oxycodone bid with prn doses as needed. Lidocaine patch for neck pain  -gabapentin for feet 4. H/o anxiety disorder/Mood: Will continue xanax prn for now. Patient has poor insight and lacks awareness of deficits. LCSW to follow along for support and evaluation when indicated.  5. Neuropsych: This patient is not  capable of making decisions on his own behalf.  -seems to be doing better with seroquel hs---increased to 50 which helped further 6. Skin/Wound Care: Pressure relief measures. Continue air mattress overlay.  Eucerin cream for dry skin bilateral feet.  7. Fluids/Electrolytes/Nutrition: Monitor I/O. Electrolytes/bun/cr all look reasonable  -off bolus feeds entirely  -continue 100cc h20 flushes until liquids are increased to regular/thin 8. ABLA:  hgb 11.5 9. Pulmonary: trach out. Stoma closed.   10.  Severe dysphagia,   -D1 diet with nectars     LOS (Days) 28 A FACE TO FACE EVALUATION WAS PERFORMED  Chazlyn Cude T 08/06/2014 8:44 AM

## 2014-09-01 ENCOUNTER — Inpatient Hospital Stay: Payer: Medicare Other | Admitting: Physical Medicine & Rehabilitation

## 2014-09-25 ENCOUNTER — Inpatient Hospital Stay: Payer: Medicare Other | Admitting: Physical Medicine & Rehabilitation

## 2016-02-04 IMAGING — CR DG ABD PORTABLE 1V
1 series · 1 of 1 positions shown · non-contrast
Comparison: 06/15/2014

CLINICAL DATA: Nausea and evaluate for constipation.

EXAM:
PORTABLE ABDOMEN - 1 VIEW

[AP]
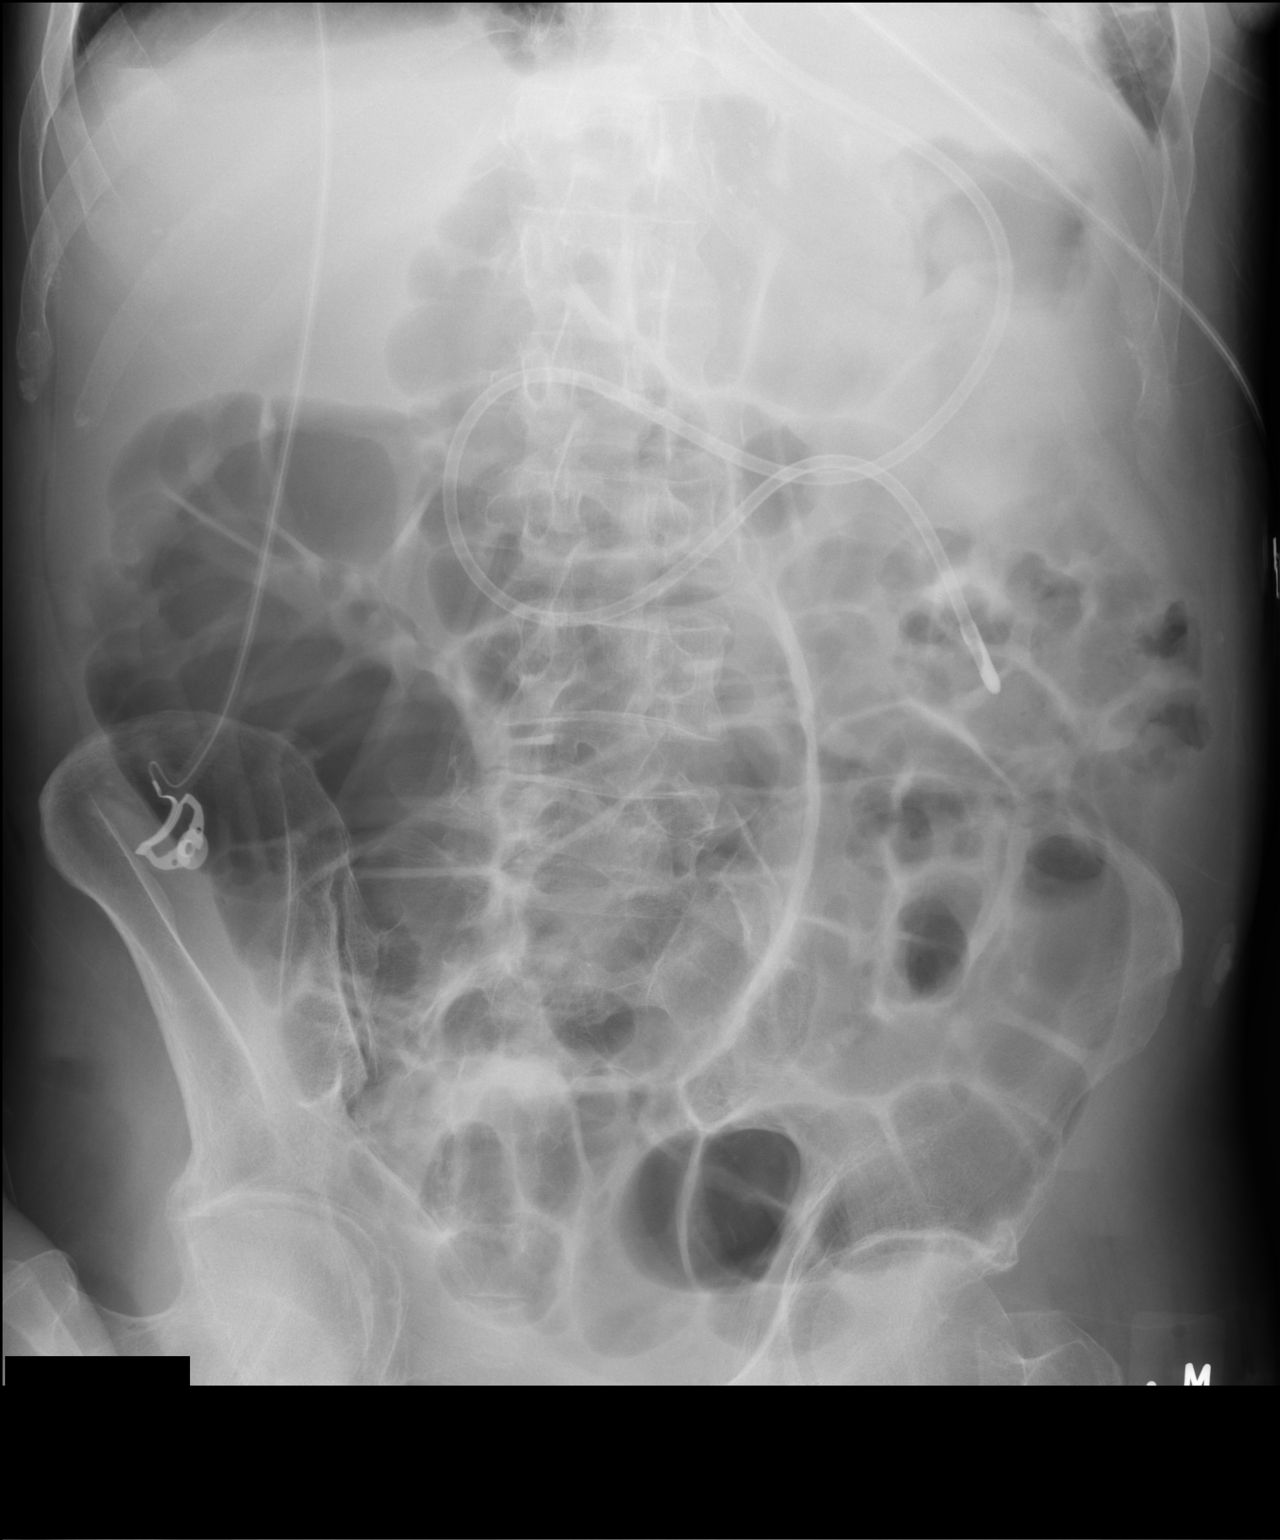

[1 of 1 positions shown; findings below may reference images not displayed]

FINDINGS: Feeding tube tip is in the region of the proximal jejunum. Again
noted are gas-filled loops of bowel throughout the abdomen and the
degree of bowel gas distention is similar to the previous
examination. There does not appear to be a large amount of stool in
the abdomen or pelvis.
IMPRESSION: Feeding tube tip in the proximal jejunum.

Stable gas-filled loops of bowel throughout the abdomen. Findings
could be associated with an ileus pattern.

## 2016-02-14 IMAGING — NM NM HEPATOBILIARY IMAGE, INC GB
2 series · 7 of 7 positions shown · non-contrast
Comparison: CT abdomen and pelvis 06/22/2014

CLINICAL DATA: Distended gallbladder on CT, abdominal pain question
cholecystitis, elevated LFTs

EXAM:
NUCLEAR MEDICINE HEPATOBILIARY IMAGING
TECHNIQUE: Sequential images of the abdomen were obtained [DATE] minutes
following intravenous administration of radiopharmaceutical. An
additional delayed LAO image was obtained.
RADIOPHARMACEUTICALS:  5 mCi 3c-IIm Choletec IV

[he hepatobiliary · 3.43mm/px · 6 of 60 frames shown (1 of 2)]
[frame 6/60]
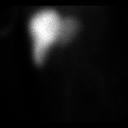
[frame 16/60]
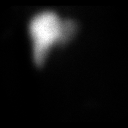
[frame 26/60]
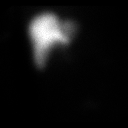
[frame 36/60]
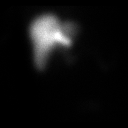
[frame 46/60]
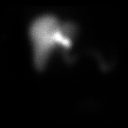
[frame 56/60]
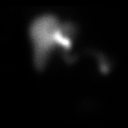

[he hepatobiliary · 1 of 1 slices shown (2 of 2)]
[im 1/1]
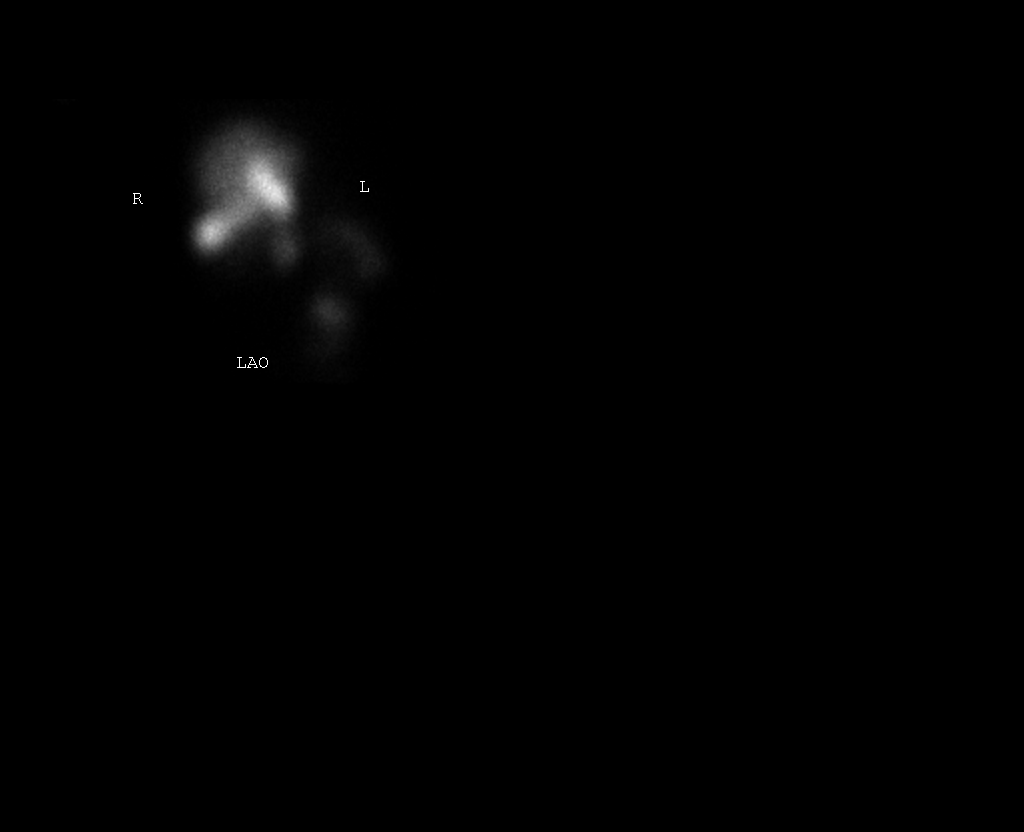

[7 of 7 positions shown; findings below may reference images not displayed]

FINDINGS: Normal tracer extraction from bloodstream indicating normal
hepatocellular function.

Prompt excretion of tracer into biliary tree.

Small bowel visualized at 30 min.

At 60 min, gallbladder had not visualized.

No focal hepatic retention of tracer.

Prior to patient receiving morphine, tracer is identified within the
gallbladder consistent with a patent cystic duct.
IMPRESSION: Patent biliary tree without evidence of acute cholecystitis or
cystic duct obstruction.

## 2016-02-18 IMAGING — CR DG CHEST 1V PORT
1 series · 1 of 1 positions shown · non-contrast
Comparison: 06/25/2014, 06/15/2014, 06/09/2014

CLINICAL DATA: Subsequent evaluation for respiratory failure

EXAM:
PORTABLE CHEST - 1 VIEW

[AP]
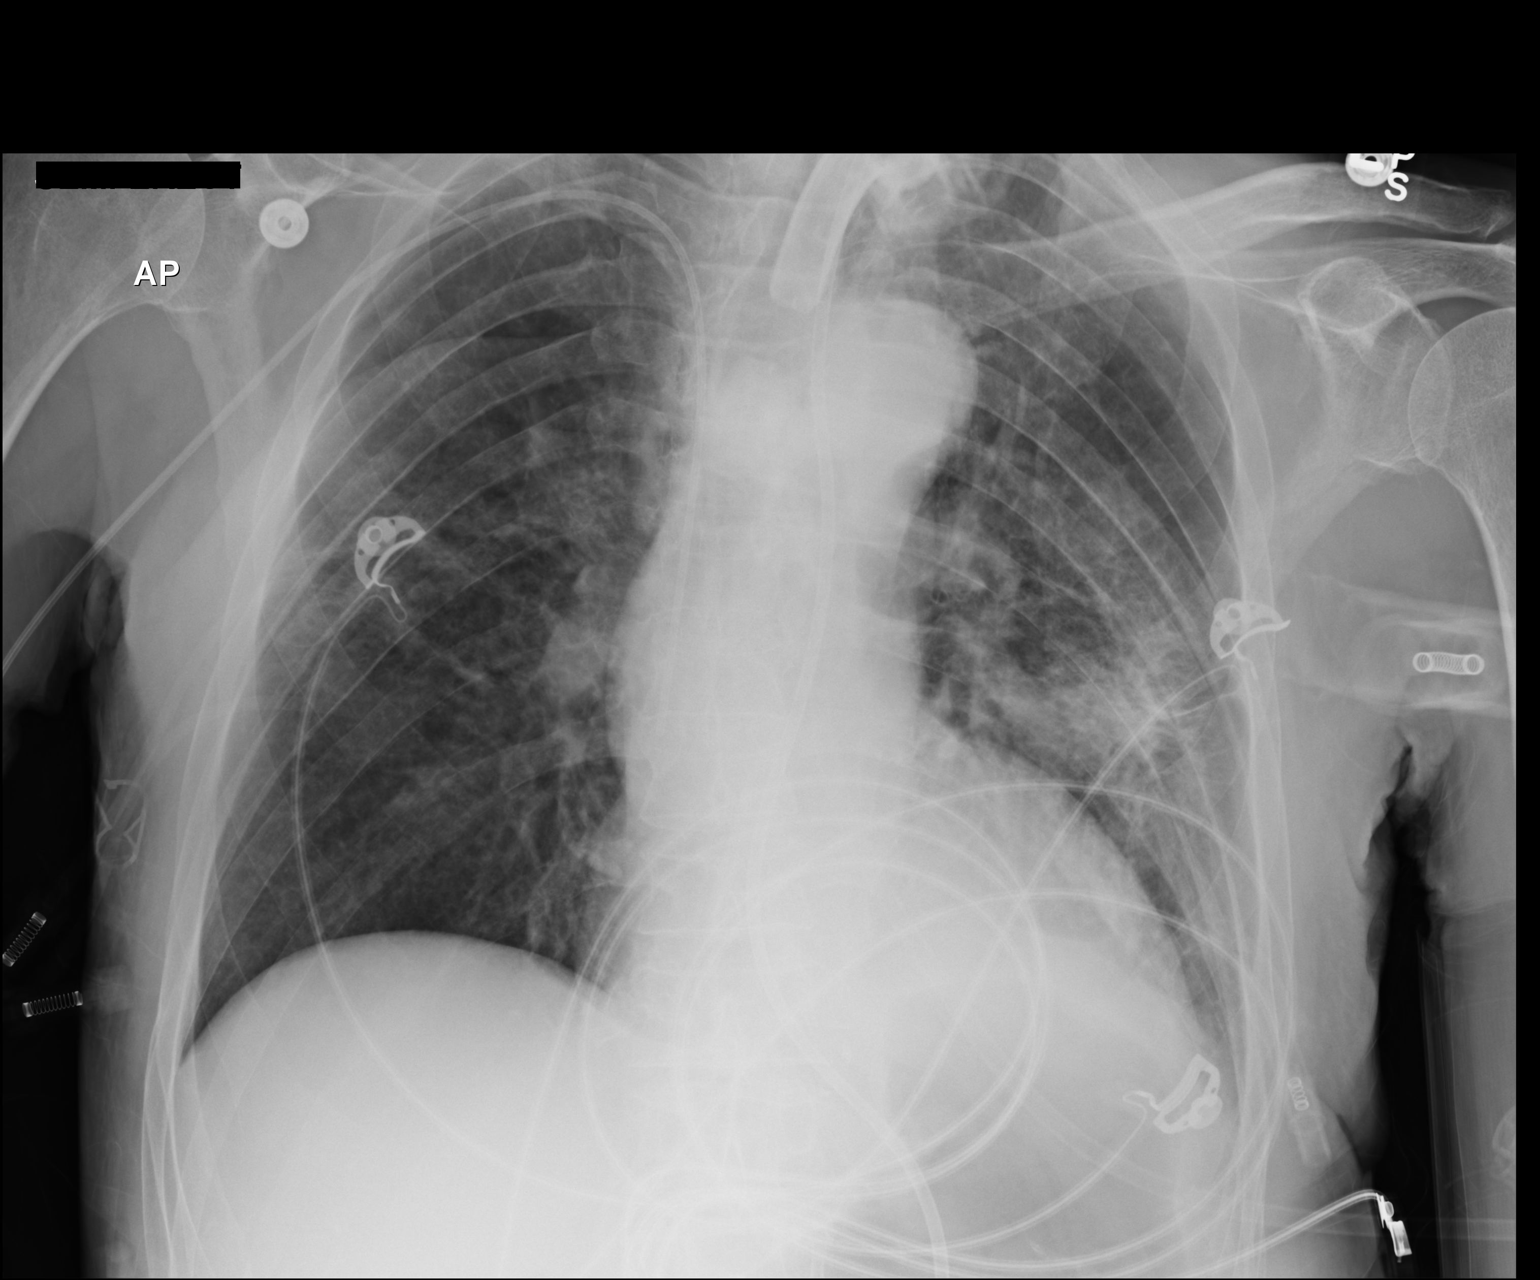

[1 of 1 positions shown; findings below may reference images not displayed]

FINDINGS: Support devices stable.  Stable mild cardiac enlargement.

Mild right middle lobe interstitial change peripherally is stable
from 06/15/2014. On the left, there is 4 cm focus infiltrate. This
is also not significantly different from prior studies. Mild left
costophrenic angle blunting is stable which suggests the possibility
of a tiny left pleural effusion.
IMPRESSION: Stable mild interstitial change right middle lobe. This could
represent slowly resolving pneumonia or scarring. On the left, more
prominent infiltrate is also not significantly changed from
06/15/2014, appearing minimally left extensive when compared to
06/09/2014. This may represent very slowly resolving pneumonia or
pneumonitis. However, if this fails to resolve in a timely fashion,
CT thorax would be suggested to exclude underlying mass.
